# Patient Record
Sex: Male | Born: 1970 | Race: Black or African American | Hispanic: No | Marital: Single | Smoking: Never smoker
Health system: Southern US, Community
[De-identification: ages and names within clinical notes are randomized; demographics above are authoritative.]

## PROBLEM LIST (undated history)

## (undated) DIAGNOSIS — E119 Type 2 diabetes mellitus without complications: Secondary | ICD-10-CM

## (undated) HISTORY — DX: Type 2 diabetes mellitus without complications: E11.9

---

## 2004-05-25 ENCOUNTER — Emergency Department (HOSPITAL_COMMUNITY): Admission: EM | Admit: 2004-05-25 | Discharge: 2004-05-25 | Payer: Self-pay | Admitting: Emergency Medicine

## 2006-08-02 ENCOUNTER — Emergency Department (HOSPITAL_COMMUNITY): Admission: EM | Admit: 2006-08-02 | Discharge: 2006-08-02 | Payer: Self-pay | Admitting: Emergency Medicine

## 2007-03-28 ENCOUNTER — Emergency Department (HOSPITAL_COMMUNITY): Admission: EM | Admit: 2007-03-28 | Discharge: 2007-03-28 | Payer: Self-pay | Admitting: Emergency Medicine

## 2009-05-05 ENCOUNTER — Emergency Department (HOSPITAL_COMMUNITY): Admission: EM | Admit: 2009-05-05 | Discharge: 2009-05-05 | Payer: Self-pay | Admitting: Emergency Medicine

## 2010-12-31 LAB — URINALYSIS, ROUTINE W REFLEX MICROSCOPIC
Bilirubin Urine: NEGATIVE
Glucose, UA: NEGATIVE mg/dL
Ketones, ur: NEGATIVE mg/dL
Nitrite: NEGATIVE
Protein, ur: NEGATIVE mg/dL
Specific Gravity, Urine: 1.017 (ref 1.005–1.030)
Urobilinogen, UA: 1 mg/dL (ref 0.0–1.0)
pH: 6 (ref 5.0–8.0)

## 2010-12-31 LAB — URINE MICROSCOPIC-ADD ON

## 2012-11-28 ENCOUNTER — Ambulatory Visit: Payer: Self-pay | Admitting: *Deleted

## 2012-12-01 ENCOUNTER — Ambulatory Visit: Payer: Self-pay | Admitting: *Deleted

## 2012-12-17 ENCOUNTER — Encounter: Payer: Self-pay | Admitting: *Deleted

## 2012-12-17 ENCOUNTER — Encounter: Payer: BC Managed Care – PPO | Attending: *Deleted | Admitting: *Deleted

## 2012-12-17 VITALS — Ht 72.0 in | Wt 198.8 lb

## 2012-12-17 DIAGNOSIS — Z713 Dietary counseling and surveillance: Secondary | ICD-10-CM | POA: Insufficient documentation

## 2012-12-17 DIAGNOSIS — E119 Type 2 diabetes mellitus without complications: Secondary | ICD-10-CM | POA: Insufficient documentation

## 2012-12-17 NOTE — Patient Instructions (Signed)
Goals:  Follow Diabetes Meal Plan as instructed  Eat 3 meals and 2 snacks, every 3-5 hrs  Limit carbohydrate intake to 60-75 grams carbohydrate/meal  Limit carbohydrate intake to 30 grams carbohydrate/snack  Add lean protein foods to meals/snacks  Monitor glucose levels as instructed by your doctor  Aim for 30 mins of physical activity daily  Bring food record and glucose log to your next nutrition visit 

## 2012-12-17 NOTE — Progress Notes (Signed)
Diabetes Self-Management Education  Visit Number: First/Initial  12/17/2012 [name], identified by name and date of birth, is a 42 y.o. male with Diabetes Type: Type 2.  Other people present during visit:  Patient   ASSESSMENT  Patient Concerns:  Nutrition/Meal planning  Height 6' (1.829 m), weight 198 lb 12.8 oz (90.175 kg). Body mass index is 26.96 kg/(m^2).  Lab Results: HbA1c:12.9 mg/dl on 9/60/45   History reviewed. No pertinent family history. Patient ignorant of pertinent family histor  Support Systems:  Lives Alone Special Needs:  None  Prior DM Education:   Daily Foot Exams: No Patient Belief / Attitude about Diabetes:  Diabetes can be controlled    Diet Recall:  Skips breakfast currently because doesn't know what to eat.  Used to eat cereal with whole milk Might snack on some chips with water Eats rice, vegetables for lunch No pm snack Dinner is African food: rice, beans, meat, some vegetables   Education Topics Reviewed with Patient Today:  Topic Points Discussed  Disease State Factors that contribute to the development of diabetes  Nutrition Management Role of diet in the treatment of diabetes and the relationship between the three main macronutrients and blood glucose level;Food label reading, portion sizes and measuring food.;Carbohydrate counting  Physical Activity and Exercise Other (comment) (patient is already active)  Medications Other (comment) (educated patient to take metformin with food)  Monitoring Interpreting lab values - A1C, lipid, urine microalbumina.;Purpose and frequency of SMBG.  Acute Complications    Chronic Complications Relationship between chronic complications and blood glucose control  Psychosocial Adjustment    Goal Setting    Preconception Care (if applicable)      PATIENTS GOALS   Learning Objective(s):     Goal The patient agrees to:  Nutrition Follow meal plan discussed  Physical Activity Exercise 3-5 times  per week  Medications take my medication as prescribed  Monitoring Other (comment) (call insurance and start monitoring)  Problem Solving    Reducing Risk    Health Coping       If problems or questions, patient to contact team via:  Phone  Time in: 1030     Time out: 1130  Future DSME appointment: - 3-4 months   Jon Hoffman 12/17/2012 11:23 AM

## 2013-01-02 ENCOUNTER — Encounter: Payer: Self-pay | Admitting: Obstetrics and Gynecology

## 2013-03-20 ENCOUNTER — Ambulatory Visit: Payer: BC Managed Care – PPO | Admitting: *Deleted

## 2017-06-06 ENCOUNTER — Ambulatory Visit (INDEPENDENT_AMBULATORY_CARE_PROVIDER_SITE_OTHER): Payer: Self-pay | Admitting: Physician Assistant

## 2017-06-06 ENCOUNTER — Emergency Department (HOSPITAL_COMMUNITY): Payer: Self-pay

## 2017-06-06 ENCOUNTER — Encounter (HOSPITAL_COMMUNITY): Payer: Self-pay

## 2017-06-06 ENCOUNTER — Inpatient Hospital Stay (HOSPITAL_COMMUNITY)
Admission: EM | Admit: 2017-06-06 | Discharge: 2017-06-12 | DRG: 853 | Disposition: A | Discharge status: Home / Self Care | Payer: Self-pay | Attending: Family Medicine | Admitting: Family Medicine

## 2017-06-06 ENCOUNTER — Encounter: Payer: Self-pay | Admitting: Physician Assistant

## 2017-06-06 ENCOUNTER — Ambulatory Visit (INDEPENDENT_AMBULATORY_CARE_PROVIDER_SITE_OTHER): Payer: Self-pay

## 2017-06-06 VITALS — BP 128/97 | HR 138 | Temp 99.9°F | Resp 18

## 2017-06-06 DIAGNOSIS — R079 Chest pain, unspecified: Secondary | ICD-10-CM

## 2017-06-06 DIAGNOSIS — Z9889 Other specified postprocedural states: Secondary | ICD-10-CM

## 2017-06-06 DIAGNOSIS — Z23 Encounter for immunization: Secondary | ICD-10-CM

## 2017-06-06 DIAGNOSIS — R0602 Shortness of breath: Secondary | ICD-10-CM

## 2017-06-06 DIAGNOSIS — Z6828 Body mass index (BMI) 28.0-28.9, adult: Secondary | ICD-10-CM

## 2017-06-06 DIAGNOSIS — E872 Acidosis: Secondary | ICD-10-CM | POA: Diagnosis present

## 2017-06-06 DIAGNOSIS — Z9114 Patient's other noncompliance with medication regimen: Secondary | ICD-10-CM

## 2017-06-06 DIAGNOSIS — E663 Overweight: Secondary | ICD-10-CM | POA: Diagnosis present

## 2017-06-06 DIAGNOSIS — IMO0002 Reserved for concepts with insufficient information to code with codable children: Secondary | ICD-10-CM | POA: Diagnosis present

## 2017-06-06 DIAGNOSIS — Z7984 Long term (current) use of oral hypoglycemic drugs: Secondary | ICD-10-CM

## 2017-06-06 DIAGNOSIS — A419 Sepsis, unspecified organism: Principal | ICD-10-CM | POA: Diagnosis present

## 2017-06-06 DIAGNOSIS — E118 Type 2 diabetes mellitus with unspecified complications: Secondary | ICD-10-CM

## 2017-06-06 DIAGNOSIS — K029 Dental caries, unspecified: Secondary | ICD-10-CM | POA: Diagnosis present

## 2017-06-06 DIAGNOSIS — Z6825 Body mass index (BMI) 25.0-25.9, adult: Secondary | ICD-10-CM

## 2017-06-06 DIAGNOSIS — E876 Hypokalemia: Secondary | ICD-10-CM | POA: Diagnosis not present

## 2017-06-06 DIAGNOSIS — J9601 Acute respiratory failure with hypoxia: Secondary | ICD-10-CM | POA: Diagnosis present

## 2017-06-06 DIAGNOSIS — J984 Other disorders of lung: Secondary | ICD-10-CM

## 2017-06-06 DIAGNOSIS — E669 Obesity, unspecified: Secondary | ICD-10-CM | POA: Diagnosis present

## 2017-06-06 DIAGNOSIS — E1165 Type 2 diabetes mellitus with hyperglycemia: Secondary | ICD-10-CM | POA: Diagnosis present

## 2017-06-06 DIAGNOSIS — J181 Lobar pneumonia, unspecified organism: Secondary | ICD-10-CM

## 2017-06-06 DIAGNOSIS — J188 Other pneumonia, unspecified organism: Secondary | ICD-10-CM | POA: Diagnosis present

## 2017-06-06 DIAGNOSIS — J189 Pneumonia, unspecified organism: Secondary | ICD-10-CM | POA: Diagnosis present

## 2017-06-06 LAB — CBC
HCT: 40.7 % (ref 39.0–52.0)
HEMOGLOBIN: 13.7 g/dL (ref 13.0–17.0)
MCH: 26.6 pg (ref 26.0–34.0)
MCHC: 33.7 g/dL (ref 30.0–36.0)
MCV: 79 fL (ref 78.0–100.0)
Platelets: 206 10*3/uL (ref 150–400)
RBC: 5.15 MIL/uL (ref 4.22–5.81)
RDW: 13.6 % (ref 11.5–15.5)
WBC: 14.7 10*3/uL — ABNORMAL HIGH (ref 4.0–10.5)

## 2017-06-06 LAB — I-STAT CG4 LACTIC ACID, ED
LACTIC ACID, VENOUS: 2.08 mmol/L — AB (ref 0.5–1.9)
Lactic Acid, Venous: 1.96 mmol/L — ABNORMAL HIGH (ref 0.5–1.9)
Lactic Acid, Venous: 1.11 mmol/L (ref 0.5–1.9)

## 2017-06-06 LAB — URINALYSIS, ROUTINE W REFLEX MICROSCOPIC
BILIRUBIN URINE: NEGATIVE
Bacteria, UA: NONE SEEN
KETONES UR: 80 mg/dL — AB
LEUKOCYTES UA: NEGATIVE
NITRITE: NEGATIVE
PH: 6 (ref 5.0–8.0)
PROTEIN: 100 mg/dL — AB
Specific Gravity, Urine: 1.026 (ref 1.005–1.030)

## 2017-06-06 LAB — PROCALCITONIN: Procalcitonin: 1.17 ng/mL

## 2017-06-06 LAB — LACTIC ACID, PLASMA: LACTIC ACID, VENOUS: 1.6 mmol/L (ref 0.5–1.9)

## 2017-06-06 LAB — I-STAT TROPONIN, ED: TROPONIN I, POC: 0.02 ng/mL (ref 0.00–0.08)

## 2017-06-06 LAB — RAPID URINE DRUG SCREEN, HOSP PERFORMED
AMPHETAMINES: NOT DETECTED
BENZODIAZEPINES: NOT DETECTED
Barbiturates: NOT DETECTED
COCAINE: NOT DETECTED
OPIATES: NOT DETECTED
Tetrahydrocannabinol: NOT DETECTED

## 2017-06-06 LAB — POCT CBC
GRANULOCYTE PERCENT: 78.2 % (ref 37–80)
HEMATOCRIT: 43.5 % (ref 43.5–53.7)
Hemoglobin: 14.2 g/dL (ref 14.1–18.1)
LYMPH, POC: 1.8 (ref 0.6–3.4)
MCH, POC: 26.1 pg — AB (ref 27–31.2)
MCHC: 32.8 g/dL (ref 31.8–35.4)
MCV: 79.6 fL — AB (ref 80–97)
MID (cbc): 1.4 — AB (ref 0–0.9)
MPV: 9.3 fL (ref 0–99.8)
POC GRANULOCYTE: 11.7 — AB (ref 2–6.9)
POC LYMPH %: 12.1 % (ref 10–50)
POC MID %: 9.7 % (ref 0–12)
Platelet Count, POC: 209 10*3/uL (ref 142–424)
RBC: 5.46 M/uL (ref 4.69–6.13)
RDW, POC: 14.1 %
WBC: 14.9 10*3/uL — AB (ref 4.6–10.2)

## 2017-06-06 LAB — SAVE SMEAR

## 2017-06-06 LAB — RAPID HIV SCREEN (HIV 1/2 AB+AG)
HIV 1/2 Antibodies: NONREACTIVE
HIV-1 P24 ANTIGEN - HIV24: NONREACTIVE

## 2017-06-06 LAB — GLUCOSE, CAPILLARY: Glucose-Capillary: 256 mg/dL — ABNORMAL HIGH (ref 65–99)

## 2017-06-06 LAB — BASIC METABOLIC PANEL
ANION GAP: 13 (ref 5–15)
BUN: 12 mg/dL (ref 6–20)
CALCIUM: 8.9 mg/dL (ref 8.9–10.3)
CO2: 26 mmol/L (ref 22–32)
CREATININE: 1.21 mg/dL (ref 0.61–1.24)
Chloride: 97 mmol/L — ABNORMAL LOW (ref 101–111)
GFR calc Af Amer: >60 mL/min (ref >60)
GLUCOSE: 372 mg/dL — AB (ref 65–99)
Potassium: 4 mmol/L (ref 3.5–5.1)
Sodium: 136 mmol/L (ref 135–145)

## 2017-06-06 LAB — APTT: aPTT: 33 seconds (ref 24–36)

## 2017-06-06 LAB — HEPATIC FUNCTION PANEL
ALT: 20 U/L (ref 17–63)
AST: 14 U/L — ABNORMAL LOW (ref 15–41)
Albumin: 3.6 g/dL (ref 3.5–5.0)
Alkaline Phosphatase: 78 U/L (ref 38–126)
BILIRUBIN DIRECT: 0.1 mg/dL (ref 0.1–0.5)
BILIRUBIN INDIRECT: 1 mg/dL — AB (ref 0.3–0.9)
Total Bilirubin: 1.1 mg/dL (ref 0.3–1.2)
Total Protein: 8.1 g/dL (ref 6.5–8.1)

## 2017-06-06 LAB — POCT URINALYSIS DIP (MANUAL ENTRY)
BILIRUBIN UA: NEGATIVE
Glucose, UA: >=1000 mg/dL — AB
LEUKOCYTES UA: NEGATIVE
Nitrite, UA: NEGATIVE
SPEC GRAV UA: 1.02 (ref 1.010–1.025)
Urobilinogen, UA: 0.2 E.U./dL
pH, UA: 5.5 (ref 5.0–8.0)

## 2017-06-06 LAB — CREATININE, SERUM: Creatinine, Ser: 1.13 mg/dL (ref 0.61–1.24)

## 2017-06-06 LAB — CBG MONITORING, ED
Glucose-Capillary: 379 mg/dL — ABNORMAL HIGH (ref 65–99)
Glucose-Capillary: 290 mg/dL — ABNORMAL HIGH (ref 65–99)

## 2017-06-06 LAB — DIFFERENTIAL
BASOS ABS: 0 10*3/uL (ref 0.0–0.1)
BASOS PCT: 0 %
Eosinophils Absolute: 0 10*3/uL (ref 0.0–0.7)
Eosinophils Relative: 0 %
LYMPHS ABS: 1.3 10*3/uL (ref 0.7–4.0)
LYMPHS PCT: 9 %
MONOS PCT: 10 %
Monocytes Absolute: 1.5 10*3/uL — ABNORMAL HIGH (ref 0.1–1.0)
NEUTROS ABS: 11.6 10*3/uL — AB (ref 1.7–7.7)
Neutrophils Relative %: 81 %

## 2017-06-06 LAB — PROTIME-INR
INR: 1.05
PROTHROMBIN TIME: 13.6 s (ref 11.4–15.2)

## 2017-06-06 LAB — SEDIMENTATION RATE: SED RATE: 47 mm/h — AB (ref 0–16)

## 2017-06-06 LAB — LIPASE, BLOOD: LIPASE: 18 U/L (ref 11–51)

## 2017-06-06 LAB — GLUCOSE, POCT (MANUAL RESULT ENTRY): POC Glucose: 393 mg/dl — AB (ref 70–99)

## 2017-06-06 MED ORDER — ENOXAPARIN SODIUM 40 MG/0.4ML ~~LOC~~ SOLN
40.0000 mg | Freq: Every day | SUBCUTANEOUS | Status: DC
  Administered 2017-06-07 – 2017-06-11 (×5): 40 mg via SUBCUTANEOUS
  Filled 2017-06-06 (×5): qty 0.4

## 2017-06-06 MED ORDER — ACETAMINOPHEN 650 MG RE SUPP: 650.0000 mg | Freq: Four times a day (QID) | RECTAL | Status: DC | PRN

## 2017-06-06 MED ORDER — ONDANSETRON HCL 4 MG PO TABS: 4.0000 mg | ORAL_TABLET | Freq: Four times a day (QID) | ORAL | Status: DC | PRN

## 2017-06-06 MED ORDER — POLYETHYLENE GLYCOL 3350 17 G PO PACK
17.0000 g | PACK | Freq: Every day | ORAL | Status: DC | PRN
  Administered 2017-06-11: 17 g via ORAL
  Filled 2017-06-06: qty 1

## 2017-06-06 MED ORDER — AZITHROMYCIN 500 MG IV SOLR
500.0000 mg | INTRAVENOUS | Status: DC
  Administered 2017-06-07 – 2017-06-12 (×5): 500 mg via INTRAVENOUS
  Filled 2017-06-06 (×6): qty 500

## 2017-06-06 MED ORDER — SODIUM CHLORIDE 0.9 % IV BOLUS (SEPSIS)
1000.0000 mL | Freq: Once | INTRAVENOUS | Status: AC
  Administered 2017-06-06: 1000 mL via INTRAVENOUS

## 2017-06-06 MED ORDER — TUBERCULIN PPD 5 UNIT/0.1ML ID SOLN
5.0000 [IU] | Freq: Once | INTRADERMAL | Status: AC
  Administered 2017-06-06: 5 [IU] via INTRADERMAL
  Filled 2017-06-06: qty 0.1

## 2017-06-06 MED ORDER — IPRATROPIUM-ALBUTEROL 0.5-2.5 (3) MG/3ML IN SOLN: 3.0000 mL | RESPIRATORY_TRACT | Status: DC | PRN

## 2017-06-06 MED ORDER — DEXTROSE 5 % IV SOLN
1.0000 g | Freq: Once | INTRAVENOUS | Status: AC
  Administered 2017-06-06: 1 g via INTRAVENOUS
  Filled 2017-06-06: qty 10

## 2017-06-06 MED ORDER — ONDANSETRON HCL 4 MG/2ML IJ SOLN: 4.0000 mg | Freq: Four times a day (QID) | INTRAMUSCULAR | Status: DC | PRN

## 2017-06-06 MED ORDER — SODIUM CHLORIDE 0.9 % IV SOLN
1000.0000 mL | INTRAVENOUS | Status: DC
  Administered 2017-06-06: 1000 mL via INTRAVENOUS

## 2017-06-06 MED ORDER — ACETAMINOPHEN 325 MG PO TABS
650.0000 mg | ORAL_TABLET | Freq: Four times a day (QID) | ORAL | Status: DC | PRN
  Administered 2017-06-06 – 2017-06-11 (×6): 650 mg via ORAL
  Filled 2017-06-06 (×6): qty 2

## 2017-06-06 MED ORDER — SODIUM CHLORIDE 0.9 % IV SOLN
INTRAVENOUS | Status: DC
  Administered 2017-06-06 – 2017-06-08 (×3): via INTRAVENOUS

## 2017-06-06 MED ORDER — INSULIN ASPART 100 UNIT/ML ~~LOC~~ SOLN
0.0000 [IU] | Freq: Every day | SUBCUTANEOUS | Status: DC
  Administered 2017-06-06 – 2017-06-08 (×3): 3 [IU] via SUBCUTANEOUS
  Administered 2017-06-09 – 2017-06-10 (×2): 2 [IU] via SUBCUTANEOUS

## 2017-06-06 MED ORDER — INSULIN ASPART 100 UNIT/ML ~~LOC~~ SOLN
0.0000 [IU] | Freq: Three times a day (TID) | SUBCUTANEOUS | Status: DC
  Administered 2017-06-07: 5 [IU] via SUBCUTANEOUS
  Administered 2017-06-07 (×2): 3 [IU] via SUBCUTANEOUS
  Administered 2017-06-08: 5 [IU] via SUBCUTANEOUS
  Administered 2017-06-08 (×2): 2 [IU] via SUBCUTANEOUS
  Administered 2017-06-09: 7 [IU] via SUBCUTANEOUS
  Administered 2017-06-09: 2 [IU] via SUBCUTANEOUS
  Administered 2017-06-09: 1 [IU] via SUBCUTANEOUS
  Administered 2017-06-10: 3 [IU] via SUBCUTANEOUS
  Administered 2017-06-10: 7 [IU] via SUBCUTANEOUS

## 2017-06-06 MED ORDER — IOPAMIDOL (ISOVUE-370) INJECTION 76%
INTRAVENOUS | Status: AC
  Filled 2017-06-06: qty 100

## 2017-06-06 MED ORDER — IOPAMIDOL (ISOVUE-370) INJECTION 76%
100.0000 mL | Freq: Once | INTRAVENOUS | Status: AC | PRN
  Administered 2017-06-06: 100 mL via INTRAVENOUS

## 2017-06-06 MED ORDER — DEXTROSE 5 % IV SOLN
500.0000 mg | Freq: Once | INTRAVENOUS | Status: AC
  Administered 2017-06-06: 500 mg via INTRAVENOUS
  Filled 2017-06-06: qty 500

## 2017-06-06 MED ORDER — DEXTROSE 5 % IV SOLN
1.0000 g | INTRAVENOUS | Status: DC
  Filled 2017-06-06: qty 10

## 2017-06-06 NOTE — ED Provider Notes (Signed)
[facility]-EMERGENCY DEPT Provider Note   CSN: [id number] Arrival date & time: 06/06/17  1103     History   Chief Complaint Chief Complaint  Patient presents with  . Chest Pain    HPI Jon Hoffman is a 46 y.o. male.  HPI PATIENT REPORTS HE HAS RIGHT SIDED PAIN ON HIS LOWER CHEST. He states that he has a sharp pain it's been constant and worse with deep inspiration.Patient reports he just returned from a 14 hour driving trip from [place]. He denies pain or swelling in his legs. He denies similar problems with chest pain or shortness of breath. He denies recent fever, cough or hemoptysis. He denies nausea vomiting or abdominal pain. Patient denies prior history or known family history of DVT or PE. Patient is diabetic.He reports he ran out of his metformin and has not taken it for approximately 2 weeks. He denies any other known medical problems. Patient is from [place]. He reports he moved to the states in 2002. He reports last travel was 3 years ago. He reports he may have had malaria at one point time when he was in [place].Patient is nonsmoker. He denies drug use. Past Medical History:  Diagnosis Date  . Diabetes mellitus without complication (HCC)     There are no active problems to display for this patient.   History reviewed. No pertinent surgical history.     Home Medications    Prior to Admission medications   Medication Sig Start Date End Date Taking? Authorizing Provider  metFORMIN (GLUCOPHAGE) 500 MG tablet Take 500 mg by mouth 2 (two) times daily with a meal.   Yes [provider]    Family History History reviewed. No pertinent family history.  Social History Social History  Substance Use Topics  . Smoking status: Never Smoker  . Smokeless tobacco: Never Used  . Alcohol use No     Allergies   Patient has no known allergies.   Review of Systems Review of Systems 10 Systems reviewed and are negative for acute change except as noted in the  HPI.   Physical Exam Updated Vital Signs BP (!) 146/90   Pulse (!) 110   Temp 98.6 F (37 C) (Oral)   Resp (!) 31   Ht  (1.778 m)   Wt 88.9 kg (196 lb)   SpO2 96%   BMI 28.12 kg/m   Physical Exam  Constitutional: He is oriented to person, place, and time.  Patient has tachypnea. Supporting airway without difficulty. Alert and nontoxic. Well-nourished well-developed.  HENT:  Head: Normocephalic and atraumatic.  Mucous membranes are slightly dry. Posterior oropharynx is widely patent. Several areas of dental decay but does not have any facial swelling.  Eyes:  Extraocular motions normal. 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 (Chronic condition which she reports is been seen by ophthalmology in the past).  Neck: Neck supple.  Cardiovascular:  Tachycardia. No rub murmur gallop.  Pulmonary/Chest:  Tachypnea. No gross wheezes rhonchi rale.  Abdominal: Soft. He exhibits no distension. There is no tenderness. There is no guarding.  Musculoskeletal: Normal range of motion. He exhibits no edema, tenderness or deformity.  Neurological: He is alert and oriented to person, place, and time. No cranial nerve deficit. He exhibits normal muscle tone. Coordination normal.  Skin: Skin is warm and dry. No rash noted.  Psychiatric: He has a normal mood and affect.     ED  Treatments / Results  Labs (all labs ordered are listed, but only abnormal results are displayed) Labs Reviewed  BASIC METABOLIC PANEL - Abnormal; Notable for the following:       Result Value   Chloride 97 (*)    Glucose, Bld 372 (*)    All other components within normal limits  CBC - Abnormal; Notable for the following:    WBC 14.7 (*)    All other components within normal limits  HEPATIC FUNCTION PANEL - Abnormal; Notable for the following:    AST 14 (*)    Indirect Bilirubin 1.0 (*)    All other components within normal limits    CBG MONITORING, ED - Abnormal; Notable for the following:    Glucose-Capillary 379 (*)    All other components within normal limits  I-STAT CG4 LACTIC ACID, ED - Abnormal; Notable for the following:    Lactic Acid, Venous 2.08 (*)    All other components within normal limits  CULTURE, BLOOD (ROUTINE X 2)  CULTURE, BLOOD (ROUTINE X 2)  LIPASE, BLOOD  URINALYSIS, ROUTINE W REFLEX MICROSCOPIC  SEDIMENTATION RATE  SAVE SMEAR  RAPID HIV SCREEN (HIV 1/2 AB+AG)  DIFFERENTIAL  RAPID URINE DRUG SCREEN, HOSP PERFORMED  I-STAT TROPONIN, ED  I-STAT CG4 LACTIC ACID, ED    EKG  EKG Interpretation  Date/Time:  Thursday June 06 2017 11:27:19 EDT Ventricular Rate:  115 PR Interval:    QRS Duration: 73 QT Interval:  323 QTC Calculation: 447 R Axis:   59 Text Interpretation:  Sinus tachycardia agree. no STEMI Confirmed by Arby Barrette [phone]) on 06/06/2017 12:30:16 PM       Radiology Dg Chest 2 View  Result Date: 06/06/2017 CLINICAL DATA:  Chest pain. EXAM: CHEST  2 VIEW COMPARISON:  Chest x-ray from same date. FINDINGS: The cardiomediastinal silhouette is normal in size. Normal pulmonary vascularity. Persistent low lung volumes with bibasilar atelectasis. No pneumothorax or large pleural effusion. No acute osseous abnormality. IMPRESSION: Low lung volumes with bibasilar atelectasis, unchanged. Electronically Signed   By: Obie Dredge M.D.   On: 06/06/2017 12:02   Dg Chest 2 View  Result Date: 06/06/2017 CLINICAL DATA:  Chest pain. EXAM: CHEST  2 VIEW COMPARISON:  None. FINDINGS: The heart size and mediastinal contours are within normal limits. Hypoinflation of the lungs is noted with mild bibasilar subsegmental atelectasis. No pneumothorax or significant pleural effusion is noted. The visualized skeletal structures are unremarkable. IMPRESSION: Hypoinflation of the lungs with mild bibasilar subsegmental atelectasis. Electronically Signed   By: Lupita Raider, M.D.   On: 06/06/2017  09:58   Ct Angio Chest Pe W/cm &/or Wo Cm  Result Date: 06/06/2017 CLINICAL DATA:  Right-sided chest pain since yesterday. EXAM: CT ANGIOGRAPHY CHEST WITH CONTRAST TECHNIQUE: Multidetector CT imaging of the chest was performed using the standard protocol during bolus administration of intravenous contrast. Multiplanar CT image reconstructions and MIPs were obtained to evaluate the vascular anatomy. CONTRAST:  100 cc Isovue 370 IV COMPARISON:  CXR from 06/06/2017 FINDINGS: Cardiovascular: Heart size is normal without pericardial effusion. No acute pulmonary embolus. No aortic aneurysm or dissection. Mediastinum/Nodes: No enlarged mediastinal, hilar, or axillary lymph nodes. Thyroid gland, trachea, and esophagus demonstrate no significant findings. Lungs/Pleura: Thinly septated cavitary abnormality in the periphery of the right lower lobe. The cavitation measures 2.2 x 1.3 x 1.5 cm, image 70/5 and axial image 58/11. Surrounding pneumonic consolidation is identified and findings may reflect a cavitary pneumonia versus a pneumonic consolidations surrounding bullous emphysematous  change. Additional patchy airspace consolidations are seen along the posteromedial aspect of the right lower lobe with tubular filling defects noted within the right lower lobe bronchus. An endobronchial lesion causing post obstructive change versus inspissated mucus are among some possibilities. There is a trace right pleural effusion. There is atelectasis in the left lower lobe, some of which appears streaky another slightly more nodular along the major fissure. No additional airspace opacities or dominant mass is identified. Upper Abdomen: Hepatic steatosis of the included liver. The adrenal glands are excluded on this study. Musculoskeletal: No chest wall abnormality. No acute or significant osseous findings. Review of the MIP images confirms the above findings. IMPRESSION: 1. Tubular filling defects within right lower lobe bronchi  causing postobstructive pulmonary consolidations in the right lower lobe. Inspissated mucus or an endobronchial lesion are not excluded. 2. Along the right lateral basal segment of the lower lobe is a patchy pulmonary consolidation with central cavitation, thinly septated in appearance internally. Trace associated right pleural effusion. Differential possibilities may include a cavitary pneumonia or pneumonia outlining bullous emphysematous change. As neoplasm cannot be entirely excluded, repeat CT imaging after appropriate antibiotic therapy 3-4 weeks is recommended to assure improvement and/or resolution. 3. Lesser degree of parenchymal opacification/consolidation in the left lower lobe. 4. No acute pulmonary embolus. 5. Hepatic steatosis. Emphysema (ICD10-J43.9). Electronically Signed   By: Tollie Eth M.D.   On: 06/06/2017 13:41    Procedures Procedures (including critical care time) CRITICAL CARE Performed by: Arby Barrette   Total critical care time:30 minutes  Critical care time was exclusive of separately billable procedures and treating other patients.  Critical care was necessary to treat or prevent imminent or life-threatening deterioration.  Critical care was time spent personally by me on the following activities: development of treatment plan with patient and/or surrogate as well as nursing, discussions with consultants, evaluation of patient's response to treatment, examination of patient, obtaining history from patient or surrogate, ordering and performing treatments and interventions, ordering and review of laboratory studies, ordering and review of radiographic studies, pulse oximetry and re-evaluation of patient's condition. Medications Ordered in ED Medications  sodium chloride 0.9 % bolus 1,000 mL (1,000 mLs Intravenous New Bag/Given 06/06/17 1348)    Followed by  0.9 %  sodium chloride infusion (1,000 mLs Intravenous New Bag/Given 06/06/17 1349)  iopamidol (ISOVUE-370) 76 %  injection (not administered)  sodium chloride 0.9 % bolus 1,000 mL (not administered)    And  sodium chloride 0.9 % bolus 1,000 mL (not administered)    And  sodium chloride 0.9 % bolus 1,000 mL (not administered)  cefTRIAXone (ROCEPHIN) 1 g in dextrose 5 % 50 mL IVPB (not administered)  azithromycin (ZITHROMAX) 500 mg in dextrose 5 % 250 mL IVPB (not administered)  tuberculin injection 5 Units (not administered)  iopamidol (ISOVUE-370) 76 % injection 100 mL (100 mLs Intravenous Contrast Given 06/06/17 1312)     Initial Impression / Assessment and Plan / ED Course  I have reviewed the triage vital signs and the nursing notes.  Pertinent labs & imaging results that were available during my care of the patient were reviewed by me and considered in my medical decision making (see chart for details).  Clinical Course as of Jun 07 1419  Thu Jun 06, 2017  1229 MCV: 79.0 [MP]    Clinical Course User Index [MP] Arby Barrette, MD  consult: Hospitalist for admission  Final Clinical Impressions(s) / ED Diagnoses   Final diagnoses:  Community acquired pneumonia of  right lower lobe of lung (HCC)  Sepsis, due to unspecified organism [facility](HCC)   Patient describes acute onset of symptoms. He reports developing right-sided chest pain and dyspnea. On examination patient is tachypneaic and tachycardic. CT scan shows a consolidation in the lower right lung. Patient is from [place] but denies travel outside of the [place]  for 3 years. Patient denies that he has had any visitors from [place]. He reports [place] alone [place]. He is a poorly treated diabetic. Patient reports been noncompliant with his metformin for about 2 weeks.patient is moderately hyperglycemic but is not acidotic. Patient's lactic acid mildly elevated. Sepsis protocol initiated for any community-acquired pneumonia.HIV and tuberculin testing added. Patient denies any history of drug abuse. New Prescriptions New Prescriptions   No  medications on file     Arby BarrettePfeiffer, Ocean Kearley, MD 06/06/17 1514

## 2017-06-06 NOTE — ED Triage Notes (Addendum)
Patient BIB EMS from walk-in [facility] office. Patient complaining of right flank pain and hyperglycemia. Patient with history of Diabetes since 2014- non-insulin dependent. Patient reports having run out of his metformin 2 weeks ago and hasnt gotten it refilled. Patient reports frequent urination. Patient reports the flank pain started at 1300 yesterday. Yesterday, patient drove from [place] to [place].  Patient oxygen saturations on RA are 100%. Patient denies SOB, but is tachypnic due to pain. Patient denies leg pain. Patient denies N/V/D/fever. Patient denies dysuria/hematuria. 20G Left AC - patient received 150mL NS from EMS.

## 2017-06-06 NOTE — Progress Notes (Signed)
RN may call [phone] at [phone].

## 2017-06-06 NOTE — ED Notes (Signed)
ED TO INPATIENT HANDOFF REPORT  Name/Age/Gender Jon Hoffman 46 y.o. male  Code Status Code Status History    This patient does not have a recorded code status. Please follow your organizational policy for patients in this situation.      Home/SNF/Other Home  Chief Complaint Hyperglycemia, Flank Pain   Level of Care/Admitting Diagnosis ED Disposition    ED Disposition Condition [facility] Area: [place] [[id number]]  Level of Care: Med-Surg [16]  Diagnosis: Acute respiratory failure with hypoxia [facility]) [[id number]]  Admitting Physician: Junius Argyle  Attending Physician: Annita Brod [[id number]]  Estimated length of stay: past midnight tomorrow  Certification:: I certify this patient will need inpatient services for at least 2 midnights  Bed request comments: TB rule out-resp isolation  PT Class (Do Not Modify): Inpatient [101]  PT Acc Code (Do Not Modify): Private [1]       Medical History Past Medical History:  Diagnosis Date  . Diabetes mellitus without complication ([place])     Allergies No Known Allergies  IV Location/Drains/Wounds Patient Lines/Drains/Airways Status   Active Line/Drains/Airways    Name:   Placement date:   Placement time:   Site:   Days:   Peripheral IV 06/06/17 Left Antecubital  06/06/17    1029    Antecubital    less than 1   Peripheral IV 06/06/17 Right Antecubital  06/06/17    1349    Antecubital    less than 1   Peripheral IV 06/06/17 Right Hand  06/06/17    1400    Hand    less than 1          Labs/Imaging Results for orders placed or performed during the hospital encounter of 06/06/17 (from the past 48 hour(s))  CBG monitoring, ED     Status: Abnormal   Collection Time: 06/06/17 11:22 AM  Result Value Ref Range   Glucose-Capillary 379 (H) 65 - 99 mg/dL   Comment 1 Notify RN    Comment 2 Document in Chart   Basic metabolic panel     Status: Abnormal   Collection Time: 06/06/17 11:25  AM  Result Value Ref Range   Sodium 136 135 - 145 mmol/L   Potassium 4.0 3.5 - 5.1 mmol/L   Chloride 97 (L) 101 - 111 mmol/L   CO2 26 22 - 32 mmol/L   Glucose, Bld 372 (H) 65 - 99 mg/dL   BUN 12 6 - 20 mg/dL   Creatinine, Ser 1.21 0.61 - 1.24 mg/dL   Calcium 8.9 8.9 - 10.3 mg/dL   GFR calc non Af Amer >60 >60 mL/min   GFR calc Af Amer >60 >60 mL/min    Comment: (NOTE) The eGFR has been calculated using the CKD EPI equation. This calculation has not been validated in all clinical situations. eGFR's persistently <60 mL/min signify possible Chronic Kidney Disease.    Anion gap 13 5 - 15  CBC     Status: Abnormal   Collection Time: 06/06/17 11:25 AM  Result Value Ref Range   WBC 14.7 (H) 4.0 - 10.5 K/uL   RBC 5.15 4.22 - 5.81 MIL/uL   Hemoglobin 13.7 13.0 - 17.0 g/dL   HCT 40.7 39.0 - 52.0 %   MCV 79.0 78.0 - 100.0 fL   MCH 26.6 26.0 - 34.0 pg   MCHC 33.7 30.0 - 36.0 g/dL   RDW 13.6 11.5 - 15.5 %   Platelets 206 150 - 400  K/uL  Hepatic function panel     Status: Abnormal   Collection Time: 06/06/17 11:25 AM  Result Value Ref Range   Total Protein 8.1 6.5 - 8.1 g/dL   Albumin 3.6 3.5 - 5.0 g/dL   AST 14 (L) 15 - 41 U/L   ALT 20 17 - 63 U/L   Alkaline Phosphatase 78 38 - 126 U/L   Total Bilirubin 1.1 0.3 - 1.2 mg/dL   Bilirubin, Direct 0.1 0.1 - 0.5 mg/dL   Indirect Bilirubin 1.0 (H) 0.3 - 0.9 mg/dL  Lipase, blood     Status: None   Collection Time: 06/06/17 11:25 AM  Result Value Ref Range   Lipase 18 11 - 51 U/L  Sedimentation rate     Status: Abnormal   Collection Time: 06/06/17 11:25 AM  Result Value Ref Range   Sed Rate 47 (H) 0 - 16 mm/hr  Save smear     Status: None   Collection Time: 06/06/17 11:25 AM  Result Value Ref Range   Smear Review SMEAR STAINED AND AVAILABLE FOR REVIEW   Differential     Status: Abnormal   Collection Time: 06/06/17 11:25 AM  Result Value Ref Range   Neutrophils Relative % 81 %   Neutro Abs 11.6 (H) 1.7 - 7.7 K/uL   Lymphocytes  Relative 9 %   Lymphs Abs 1.3 0.7 - 4.0 K/uL   Monocytes Relative 10 %   Monocytes Absolute 1.5 (H) 0.1 - 1.0 K/uL   Eosinophils Relative 0 %   Eosinophils Absolute 0.0 0.0 - 0.7 K/uL   Basophils Relative 0 %   Basophils Absolute 0.0 0.0 - 0.1 K/uL  I-stat troponin, ED     Status: None   Collection Time: 06/06/17 11:32 AM  Result Value Ref Range   Troponin i, poc 0.02 0.00 - 0.08 ng/mL   Comment 3            Comment: Due to the release kinetics of cTnI, a negative result within the first hours of the onset of symptoms does not rule out myocardial infarction with certainty. If myocardial infarction is still suspected, repeat the test at appropriate intervals.   Urinalysis, Routine w reflex microscopic     Status: Abnormal   Collection Time: 06/06/17  1:30 PM  Result Value Ref Range   Color, Urine STRAW (A) YELLOW   APPearance CLEAR CLEAR   Specific Gravity, Urine 1.026 1.005 - 1.030   pH 6.0 5.0 - 8.0   Glucose, UA >=500 (A) NEGATIVE mg/dL   Hgb urine dipstick SMALL (A) NEGATIVE   Bilirubin Urine NEGATIVE NEGATIVE   Ketones, ur 80 (A) NEGATIVE mg/dL   Protein, ur 940 (A) NEGATIVE mg/dL   Nitrite NEGATIVE NEGATIVE   Leukocytes, UA NEGATIVE NEGATIVE   RBC / HPF 0-5 0 - 5 RBC/hpf   WBC, UA 0-5 0 - 5 WBC/hpf   Bacteria, UA NONE SEEN NONE SEEN   Squamous Epithelial / LPF 0-5 (A) NONE SEEN  Urine rapid drug screen (hosp performed)     Status: None   Collection Time: 06/06/17  1:30 PM  Result Value Ref Range   Opiates NONE DETECTED NONE DETECTED   Cocaine NONE DETECTED NONE DETECTED   Benzodiazepines NONE DETECTED NONE DETECTED   Amphetamines NONE DETECTED NONE DETECTED   Tetrahydrocannabinol NONE DETECTED NONE DETECTED   Barbiturates NONE DETECTED NONE DETECTED    Comment:        DRUG SCREEN FOR MEDICAL PURPOSES ONLY.  IF CONFIRMATION  IS NEEDED FOR ANY PURPOSE, NOTIFY LAB WITHIN 5 DAYS.        LOWEST DETECTABLE LIMITS FOR URINE DRUG SCREEN Drug Class       Cutoff  (ng/mL) Amphetamine      1000 Barbiturate      200 Benzodiazepine   200 Tricyclics       300 Opiates          300 Cocaine          300 THC              50   I-Stat CG4 Lactic Acid, ED     Status: Abnormal   Collection Time: 06/06/17  1:49 PM  Result Value Ref Range   Lactic Acid, Venous 2.08 (HH) 0.5 - 1.9 mmol/L   Comment NOTIFIED PHYSICIAN   Rapid HIV screen (HIV 1/2 Ab+Ag)     Status: None   Collection Time: 06/06/17  2:17 PM  Result Value Ref Range   HIV-1 P24 Antigen - HIV24 NON REACTIVE NON REACTIVE    Comment: RESULT CALLED TO, READ BACK BY AND VERIFIED WITH: BRAWNER,A @ 1724 ON [id number] BY POTEAT,S    HIV 1/2 Antibodies NON REACTIVE NON REACTIVE   Interpretation (HIV Ag Ab)      A non reactive test result means that HIV 1 or HIV 2 antibodies and HIV 1 p24 antigen were not detected in the specimen.  I-Stat CG4 Lactic Acid, ED  (not at  [facility])     Status: Abnormal   Collection Time: 06/06/17  4:10 PM  Result Value Ref Range   Lactic Acid, Venous 1.96 (H) 0.5 - 1.9 mmol/L   Dg Chest 2 View  Result Date: 06/06/2017 CLINICAL DATA:  Chest pain. EXAM: CHEST  2 VIEW COMPARISON:  Chest x-ray from same date. FINDINGS: The cardiomediastinal silhouette is normal in size. Normal pulmonary vascularity. Persistent low lung volumes with bibasilar atelectasis. No pneumothorax or large pleural effusion. No acute osseous abnormality. IMPRESSION: Low lung volumes with bibasilar atelectasis, unchanged. Electronically Signed   By: Obie Dredge M.D.   On: 06/06/2017 12:02   Dg Chest 2 View  Result Date: 06/06/2017 CLINICAL DATA:  Chest pain. EXAM: CHEST  2 VIEW COMPARISON:  None. FINDINGS: The heart size and mediastinal contours are within normal limits. Hypoinflation of the lungs is noted with mild bibasilar subsegmental atelectasis. No pneumothorax or significant pleural effusion is noted. The visualized skeletal structures are unremarkable. IMPRESSION: Hypoinflation of the lungs with mild  bibasilar subsegmental atelectasis. Electronically Signed   By: Lupita Raider, M.D.   On: 06/06/2017 09:58   Ct Angio Chest Pe W/cm &/or Wo Cm  Result Date: 06/06/2017 CLINICAL DATA:  Right-sided chest pain since yesterday. EXAM: CT ANGIOGRAPHY CHEST WITH CONTRAST TECHNIQUE: Multidetector CT imaging of the chest was performed using the standard protocol during bolus administration of intravenous contrast. Multiplanar CT image reconstructions and MIPs were obtained to evaluate the vascular anatomy. CONTRAST:  100 cc Isovue 370 IV COMPARISON:  CXR from 06/06/2017 FINDINGS: Cardiovascular: Heart size is normal without pericardial effusion. No acute pulmonary embolus. No aortic aneurysm or dissection. Mediastinum/Nodes: No enlarged mediastinal, hilar, or axillary lymph nodes. Thyroid gland, trachea, and esophagus demonstrate no significant findings. Lungs/Pleura: Thinly septated cavitary abnormality in the periphery of the right lower lobe. The cavitation measures 2.2 x 1.3 x 1.5 cm, image 70/5 and axial image 58/11. Surrounding pneumonic consolidation is identified and findings may reflect a cavitary pneumonia versus a pneumonic consolidations surrounding bullous  emphysematous change. Additional patchy airspace consolidations are seen along the posteromedial aspect of the right lower lobe with tubular filling defects noted within the right lower lobe bronchus. An endobronchial lesion causing post obstructive change versus inspissated mucus are among some possibilities. There is a trace right pleural effusion. There is atelectasis in the left lower lobe, some of which appears streaky another slightly more nodular along the major fissure. No additional airspace opacities or dominant mass is identified. Upper Abdomen: Hepatic steatosis of the included liver. The adrenal glands are excluded on this study. Musculoskeletal: No chest wall abnormality. No acute or significant osseous findings. Review of the MIP images  confirms the above findings. IMPRESSION: 1. Tubular filling defects within right lower lobe bronchi causing postobstructive pulmonary consolidations in the right lower lobe. Inspissated mucus or an endobronchial lesion are not excluded. 2. Along the right lateral basal segment of the lower lobe is a patchy pulmonary consolidation with central cavitation, thinly septated in appearance internally. Trace associated right pleural effusion. Differential possibilities may include a cavitary pneumonia or pneumonia outlining bullous emphysematous change. As neoplasm cannot be entirely excluded, repeat CT imaging after appropriate antibiotic therapy 3-4 weeks is recommended to assure improvement and/or resolution. 3. Lesser degree of parenchymal opacification/consolidation in the left lower lobe. 4. No acute pulmonary embolus. 5. Hepatic steatosis. Emphysema (ICD10-J43.9). Electronically Signed   By: Ashley Royalty M.D.   On: 06/06/2017 13:41    Pending Labs [employer]    Start     Ordered   06/06/17 1239  Culture, blood (routine x 2)  BLOOD CULTURE X 2,   STAT     06/06/17 1239   Signed and Held  Culture, sputum-assessment  Once,   R     Signed and Held   Signed and Held  Gram stain  Once,   R     Signed and Held   Signed and Held  HIV antibody  Once,   R     Signed and Held   Signed and Held  Strep pneumoniae urinary antigen  Once,   R     Signed and Held   Signed and Held  Creatinine, serum  (enoxaparin (LOVENOX)    CrCl >/= 30 ml/min)  Once,   R    Comments:  Baseline for enoxaparin therapy IF NOT ALREADY DRAWN.    Signed and Held   Signed and Held  Creatinine, serum  (enoxaparin (LOVENOX)    CrCl >/= 30 ml/min)  Weekly,   R    Comments:  while on enoxaparin therapy    Signed and Held   Signed and Held  Basic metabolic panel  Tomorrow morning,   R     Signed and Held   Signed and Held  CBC  Tomorrow morning,   R     Signed and Held   Signed and Held  Hemoglobin A1c  Once,   R    Comments:   To assess prior glycemic control    Signed and Held   Signed and Held  Lactic acid, plasma  STAT Now then every 3 hours,   STAT     Signed and Held   Signed and Held  Procalcitonin  STAT,   R     Signed and Held   Signed and Held  Protime-INR  STAT,   R     Signed and Held   Signed and Held  APTT  STAT,   R     Signed and Held  Vitals/Pain Today's Vitals   06/06/17 1945 06/06/17 2000 06/06/17 2015 06/06/17 2030  BP: (!) 147/94 (!) 152/99 (!) 146/92 (!) 162/95  Pulse: (!) 107 (!) 110 (!) 106 (!) 112  Resp: (!) 23 (!) 28 (!) 27 (!) 30  Temp:      TempSrc:      SpO2: 96% 94% 97% 98%  Weight:      Height:      PainSc:        Isolation Precautions Airborne precautions  Medications Medications  sodium chloride 0.9 % bolus 1,000 mL (0 mLs Intravenous Stopped 06/06/17 1425)    Followed by  0.9 %  sodium chloride infusion (1,000 mLs Intravenous New Bag/Given 06/06/17 1349)  iopamidol (ISOVUE-370) 76 % injection (not administered)  tuberculin injection 5 Units (not administered)  iopamidol (ISOVUE-370) 76 % injection 100 mL (100 mLs Intravenous Contrast Given 06/06/17 1312)  sodium chloride 0.9 % bolus 1,000 mL (0 mLs Intravenous Stopped 06/06/17 1642)    And  sodium chloride 0.9 % bolus 1,000 mL (0 mLs Intravenous Stopped 06/06/17 1727)    And  sodium chloride 0.9 % bolus 1,000 mL (0 mLs Intravenous Stopped 06/06/17 1727)  cefTRIAXone (ROCEPHIN) 1 g in dextrose 5 % 50 mL IVPB (0 g Intravenous Stopped 06/06/17 1642)  azithromycin (ZITHROMAX) 500 mg in dextrose 5 % 250 mL IVPB (0 mg Intravenous Stopped 06/06/17 1642)    Mobility walks with person assist

## 2017-06-06 NOTE — H&P (Signed)
History and Physical  Isak Gauer YHO:[id number] DOB: [birth year] DOA: 06/06/2017  Referring physician: Thalia Party, ER physician  PCP: Everardo Beals, NP  Outpatient Specialists: None Patient coming from: Home & is able to ambulate without assistance  Chief Complaint: Cough and chest pain   HPI: Jon Hoffman is a 46 y.o. male with medical history significant for diabetes mellitus, although he has been without his metformin for the past 2 weeks, but otherwise states he has been his usual state of health up until yesterday. Patient is originally from [place], but does a lot of traveling for his work. He drove 17 hours from [place] to [place] pretty much straight. He arrived in [place] yesterday. Start having significant shortness of breath, cough and discomfort on the right side of his chest which he describes as an ache hurting worse when he sat up or leaned that way. He also felt feverish. Pain was unrelenting, so he came into the emergency room early this morning. ED Course:  In the emergency room, patient's white count noted to be 14.9 and a temp of 101.5. Patient is tachycardic in the 120s-130s. Initial oxygen saturations were 97% on room air although as day has progressed, has dropped somewhat into the low 90s. Patient underwent a CT scan of the chest which noted no evidence of pulmonary embolus, but did note postobstructive pulmonary consolidations in the right lower lobe possibly from mucus on endobronchial lesion which could not be excluded, but along the right lateral base of the lower lobe was patchy pulmonary consolidation with central cavitation which was felt to be a cavitary pneumonia versus pneumonia with outlying bolus emphysematous changes. Is also some lesser degree of consolidation in the left lower lobe. Following these findings, patient had a lactic acid level drawn found to be elevated at 2.08. Patient treated for sepsis felt to be secondary to pneumonia. Patient  received a total of 5 L of fluid. He was given antibiotics for community-acquired pneumonia, but given concerns with cavitary pneumonia and patient is originally from [place], TB could not be ruled out. Patient was placed in respiratory isolation. Hospitalists were called for further evaluation and admission.  Review of Systems: Patient seen in the emergency room . Pt complains of some mild chest discomfort, especially with deep inspiration. This chest discomfort is on the right-hand side as described earlier area and he does not feel really short of breath, but does complain of cough, occasionally with productive sputum. He states all of this only started yesterday. He feels feverish.   Pt denies any  headaches, vision changes, dysphagia, palpitations, wheezing, abdominal pain, hematuria, dysuria, constipation, diarrhea, focal extremity numbness weakness or pain .  Review of systems are otherwise negative   Past Medical History:  Diagnosis Date  . Diabetes mellitus without complication ([place])   Diabetes mellitus type 2, uncontrolled-has been off of medications for several weeks  History reviewed. No pertinent surgical history.  Social History:  reports that he has never smoked. He has never used smokeless tobacco. He reports that he does not drink alcohol or use drugs.  Lives at home by himself. Ambulatory without assistance.    No Known Allergies  Family history: Patient denies any family history of medical problems including heart problems or TB  Prior to Admission medications   Medication Sig Start Date End Date Taking? Authorizing Provider  metFORMIN (GLUCOPHAGE) 500 MG tablet Take 500 mg by mouth 2 (two) times daily with a meal.   Yes [provider]  Physical Exam: BP (!) 141/92   Pulse (!) 107   Temp 98.1 F (36.7 C) (Oral)   Resp (!) 28   Ht _0  (1.778 m)   Wt 88.9 kg (196 lb)   SpO2 92%   BMI 28.12 kg/m   General:  Alert and oriented 3, no acute distress    Eyes: sclera nonicteric, extraocular movements are intact  ENT: normocephalic may try to come mucus memories are dry  Neck: supple, no JVD  Cardiovascular: regular rhythm, borderline tachycardia  Respiratory: Limited inspiratory effort with right basilar crackles  Abdomen: soft, obese, nontender, positive bowel sounds  Skin: no skin breaks, tears or lesions  Musculoskeletal: no clubbing or cyanosis or edema  Psychiatric: patient is appropriate, no evidence of psychoses  Neurologic: no focal deficits           Labs on Admission:  Basic Metabolic Panel:  Recent Labs Lab 06/06/17 1125  NA 136  K 4.0  CL 97*  CO2 26  GLUCOSE 372*  BUN 12  CREATININE 1.21  CALCIUM 8.9   Liver Function Tests:  Recent Labs Lab 06/06/17 1125  AST 14*  ALT 20  ALKPHOS 78  BILITOT 1.1  PROT 8.1  ALBUMIN 3.6    Recent Labs Lab 06/06/17 1125  LIPASE 18   No results for input(s): AMMONIA in the last 168 hours. CBC:  Recent Labs Lab 06/06/17 0947 06/06/17 1125  WBC 14.9* 14.7*  NEUTROABS  --  11.6*  HGB 14.2 13.7  HCT 43.5 40.7  MCV 79.6* 79.0  PLT  --  206   Cardiac Enzymes: No results for input(s): CKTOTAL, CKMB, CKMBINDEX, TROPONINI in the last 168 hours.  BNP (last 3 results) No results for input(s): BNP in the last 8760 hours.  ProBNP (last 3 results) No results for input(s): PROBNP in the last 8760 hours.  CBG:  Recent Labs Lab 06/06/17 1122  GLUCAP 379*    Radiological Exams on Admission: Dg Chest 2 View  Result Date: 06/06/2017 CLINICAL DATA:  Chest pain. EXAM: CHEST  2 VIEW COMPARISON:  Chest x-ray from same date. FINDINGS: The cardiomediastinal silhouette is normal in size. Normal pulmonary vascularity. Persistent low lung volumes with bibasilar atelectasis. No pneumothorax or large pleural effusion. No acute osseous abnormality. IMPRESSION: Low lung volumes with bibasilar atelectasis, unchanged. Electronically Signed   By: Titus Dubin M.D.   On:  06/06/2017 12:02   Dg Chest 2 View  Result Date: 06/06/2017 CLINICAL DATA:  Chest pain. EXAM: CHEST  2 VIEW COMPARISON:  None. FINDINGS: The heart size and mediastinal contours are within normal limits. Hypoinflation of the lungs is noted with mild bibasilar subsegmental atelectasis. No pneumothorax or significant pleural effusion is noted. The visualized skeletal structures are unremarkable. IMPRESSION: Hypoinflation of the lungs with mild bibasilar subsegmental atelectasis. Electronically Signed   By: Marijo Conception, M.D.   On: 06/06/2017 09:58   Ct Angio Chest Pe W/cm &/or Wo Cm  Result Date: 06/06/2017 CLINICAL DATA:  Right-sided chest pain since yesterday. EXAM: CT ANGIOGRAPHY CHEST WITH CONTRAST TECHNIQUE: Multidetector CT imaging of the chest was performed using the standard protocol during bolus administration of intravenous contrast. Multiplanar CT image reconstructions and MIPs were obtained to evaluate the vascular anatomy. CONTRAST:  100 cc Isovue 370 IV COMPARISON:  CXR from 06/06/2017 FINDINGS: Cardiovascular: Heart size is normal without pericardial effusion. No acute pulmonary embolus. No aortic aneurysm or dissection. Mediastinum/Nodes: No enlarged mediastinal, hilar, or axillary lymph nodes. Thyroid gland, trachea, and  esophagus demonstrate no significant findings. Lungs/Pleura: Thinly septated cavitary abnormality in the periphery of the right lower lobe. The cavitation measures 2.2 x 1.3 x 1.5 cm, image 70/5 and axial image 58/11. Surrounding pneumonic consolidation is identified and findings may reflect a cavitary pneumonia versus a pneumonic consolidations surrounding bullous emphysematous change. Additional patchy airspace consolidations are seen along the posteromedial aspect of the right lower lobe with tubular filling defects noted within the right lower lobe bronchus. An endobronchial lesion causing post obstructive change versus inspissated mucus are among some possibilities.  There is a trace right pleural effusion. There is atelectasis in the left lower lobe, some of which appears streaky another slightly more nodular along the major fissure. No additional airspace opacities or dominant mass is identified. Upper Abdomen: Hepatic steatosis of the included liver. The adrenal glands are excluded on this study. Musculoskeletal: No chest wall abnormality. No acute or significant osseous findings. Review of the MIP images confirms the above findings. IMPRESSION: 1. Tubular filling defects within right lower lobe bronchi causing postobstructive pulmonary consolidations in the right lower lobe. Inspissated mucus or an endobronchial lesion are not excluded. 2. Along the right lateral basal segment of the lower lobe is a patchy pulmonary consolidation with central cavitation, thinly septated in appearance internally. Trace associated right pleural effusion. Differential possibilities may include a cavitary pneumonia or pneumonia outlining bullous emphysematous change. As neoplasm cannot be entirely excluded, repeat CT imaging after appropriate antibiotic therapy 3-4 weeks is recommended to assure improvement and/or resolution. 3. Lesser degree of parenchymal opacification/consolidation in the left lower lobe. 4. No acute pulmonary embolus. 5. Hepatic steatosis. Emphysema (ICD10-J43.9). Electronically Signed   By: Ashley Royalty M.D.   On: 06/06/2017 13:41    EKG: Independently reviewed.  sinus tachycardia  Assessment/Plan Present on Admission: . Sepsis secondary to cavitary pneumonia: Patient met criteria for sepsis on admission given tachycardia, tachypnea, fever and leukocytosis plus lactic acidosis with pulmonary source We'll treat as community-acquired pneumonia. IV Rocephin and Zithromax. Aggressive IV fluid resuscitation, follow lactic acid level Supplemental oxygen. We'll place and TB isolation. If no significant improvement by tomorrow, we'll discuss with pulmonary . CAP (community  acquired pneumonia)  . Uncontrolled type 2 diabetes mellitus with complication, without long-term current use of insulin ([place]): Patient only on metformin, but has not been on his medicines for 2 weeks. Stated he ran out and he does not really see his doctor because he is constantly traveling.  Overweight: Patient meets criteria with BMI greater than 25  Principal Problem:   Sepsis ([place]) Active Problems:   Acute respiratory failure with hypoxia ([facility])   CAP (community acquired pneumonia)   Uncontrolled type 2 diabetes mellitus with complication, without long-term current use of insulin ([facility])   DVT prophylaxis:  Lovenox  Code Status:  Full code   Family Communication:  No family   Disposition Plan:  Continue inpatient until pneumonia resolved, TB ruled out   Consults called:  None   Admission status:  Given patient's need for acute services past to midnight, admit as inpatient    Annita Brod MD [facility] Pager [phone]  If 7PM-7AM, please contact night-coverage [email or web address] Password [email or web address]  06/06/2017, 7:10 PM

## 2017-06-06 NOTE — Progress Notes (Signed)
A consult was received from an ED physician for Rocephin/Zithromax per pharmacy dosing.  The patient's profile has been reviewed for ht/wt/allergies/indication/available labs.   A one time order has been placed for Rocephin/Zithromax.  Further antibiotics/pharmacy consults should be ordered by admitting physician if indicated.                       Thank you, Junita PushMichelle Raeleen Winstanley, PharmD, BCPS 06/06/2017@2 :31 PM

## 2017-06-06 NOTE — Progress Notes (Signed)
PRIMARY CARE AT [facility] [address] [place] [address] [phone]  Date:  06/06/2017   Name:  Jon Hoffman   DOB:  01-04-1971   MRN:  [id number]  PCP:  Marva Panda, NP    History of Present Illness:  Jon Hoffman is a 46 y.o. male patient who presents to PCP with  Chief Complaint  Patient presents with  . Chest Pain    Patient presents with R side chest pain x 20 hours, no leg or arm pain, no radiation of pain.      Patient is seen here for chest pains that started yesterday, at his lower right side.  It is a sharp stabbing pain localized.  He has felt pain with deep inspiration on his right side.  No nausea, palpitations, or dyspnea--he reports.  He does not recall any injury.  He was driving for 17 hours initially.  He has had no leg or calf pain.  No coughing.  Leaning on his right side while he drove.  The pain has stayed the same and not worsening since initiated.  He does not take aspirin.  He has been out of the metformin for 2 weeks..   No dysuria, no urinary frequency.  He has not felt fever.    There are no active problems to display for this patient.   Past Medical History:  Diagnosis Date  . Diabetes mellitus without complication (HCC)     No past surgical history on file.  Social History  Substance Use Topics  . Smoking status: Never Smoker  . Smokeless tobacco: Never Used  . Alcohol use No    No family history on file.  No Known Allergies  Medication list has been reviewed and updated.  Current Outpatient Prescriptions on File Prior to Visit  Medication Sig Dispense Refill  . metFORMIN (GLUCOPHAGE) 500 MG tablet Take 500 mg by mouth 2 (two) times daily with a meal.     No current facility-administered medications on file prior to visit.     ROS ROS otherwise unremarkable unless listed above.  Physical Examination: BP (!) 128/97 (BP Location: Left Arm, Patient Position: Sitting, Cuff Size: Normal)   Pulse (!) 138   Temp 99.9 F  (37.7 C) (Oral)   Resp 18   SpO2 94%  Ideal Body Weight:    Physical Exam  Constitutional: He is oriented to person, place, and time. He appears well-developed and well-nourished. No distress.  HENT:  Head: Normocephalic and atraumatic.  Eyes: Pupils are equal, round, and reactive to light. Conjunctivae and EOM are normal.  Cardiovascular: Normal rate.   Pulmonary/Chest: Effort normal. No respiratory distress. He has no decreased breath sounds. He has no wheezes.  He is a bit tachypneic, taking short bretahs due to pain.  No decreased breath sounds ausculated on the right side.   Tender along the lower right and axillary costal position.  No   Abdominal: Soft. Normal appearance.  Right sided abdominal pain at the lower quadrant  Musculoskeletal:  No tenderness along the calf pain.    Neurological: He is alert and oriented to person, place, and time.  Skin: Skin is warm and dry. He is not diaphoretic.  Psychiatric: He has a normal mood and affect. His behavior is normal.   Results for orders placed or performed in visit on 06/06/17  POCT CBC  Result Value Ref Range   WBC 14.9 (A) 4.6 - 10.2 K/uL   Lymph, poc 1.8 0.6 - 3.4  POC LYMPH PERCENT 12.1 10 - 50 %L   MID (cbc) 1.4 (A) 0 - 0.9   POC MID % 9.7 0 - 12 %M   POC Granulocyte 11.7 (A) 2 - 6.9   Granulocyte percent 78.2 37 - 80 %G   RBC 5.46 4.69 - 6.13 M/uL   Hemoglobin 14.2 14.1 - 18.1 g/dL   HCT, POC 16.143.5 09.643.5 - 53.7 %   MCV 79.6 (A) 80 - 97 fL   MCH, POC 26.1 (A) 27 - 31.2 pg   MCHC 32.8 31.8 - 35.4 g/dL   RDW, POC 04.514.1 %   Platelet Count, POC 209 142 - 424 K/uL   MPV 9.3 0 - 99.8 fL  POCT urinalysis dipstick  Result Value Ref Range   Color, UA yellow yellow   Clarity, UA clear clear   Glucose, UA >=1,000 (A) negative mg/dL   Bilirubin, UA negative negative   Ketones, POC UA large (80) (A) negative mg/dL   Spec Grav, UA 4.0981.020 1.1911.010 - 1.025   Blood, UA trace-intact (A) negative   pH, UA 5.5 5.0 - 8.0   Protein  Ur, POC =100 (A) negative mg/dL   Urobilinogen, UA 0.2 0.2 or 1.0 E.U./dL   Nitrite, UA Negative Negative   Leukocytes, UA Negative Negative  POCT glucose (manual entry)  Result Value Ref Range   POC Glucose 393 (A) 70 - 99 mg/dl   Dg Chest 2 View  Result Date: 06/06/2017 CLINICAL DATA:  Chest pain. EXAM: CHEST  2 VIEW COMPARISON:  Chest x-ray from same date. FINDINGS: The cardiomediastinal silhouette is normal in size. Normal pulmonary vascularity. Persistent low lung volumes with bibasilar atelectasis. No pneumothorax or large pleural effusion. No acute osseous abnormality. IMPRESSION: Low lung volumes with bibasilar atelectasis, unchanged. Electronically Signed   By: Obie DredgeWilliam T Derry M.D.   On: 06/06/2017 12:02   Assessment and Plan: Jon Hoffman is a 46 y.o. male who is here today  Chest pains, short of breath 25 Diff dx includes: dka, vs. Pulmonary embolism, appendicitis, pneumonia. He will need fluids,insulin, and possible ctA IV placed, for transport by EMS.    Chest pain, unspecified type - Plan: EKG 12-Lead, POCT CBC, POCT urinalysis dipstick, DG Chest 2 View, POCT glucose (manual entry), Insert peripheral IV, sodium chloride 0.9 % bolus 1,000 mL  Shortness of breath - Plan: DG Chest 2 View, POCT glucose (manual entry)  Trena PlattStephanie English, PA-C [facility] 9/13/201812:36 PM

## 2017-06-07 DIAGNOSIS — J189 Pneumonia, unspecified organism: Secondary | ICD-10-CM

## 2017-06-07 DIAGNOSIS — J984 Other disorders of lung: Secondary | ICD-10-CM

## 2017-06-07 DIAGNOSIS — J181 Lobar pneumonia, unspecified organism: Secondary | ICD-10-CM

## 2017-06-07 LAB — CBC
HCT: 38.4 % — ABNORMAL LOW (ref 39.0–52.0)
Hemoglobin: 12.4 g/dL — ABNORMAL LOW (ref 13.0–17.0)
MCH: 26.1 pg (ref 26.0–34.0)
MCHC: 32.3 g/dL (ref 30.0–36.0)
MCV: 80.7 fL (ref 78.0–100.0)
PLATELETS: 175 10*3/uL (ref 150–400)
RBC: 4.76 MIL/uL (ref 4.22–5.81)
RDW: 13.8 % (ref 11.5–15.5)
WBC: 10.8 10*3/uL — AB (ref 4.0–10.5)

## 2017-06-07 LAB — GLUCOSE, CAPILLARY
GLUCOSE-CAPILLARY: 257 mg/dL — AB (ref 65–99)
GLUCOSE-CAPILLARY: 230 mg/dL — AB (ref 65–99)
GLUCOSE-CAPILLARY: 236 mg/dL — AB (ref 65–99)
Glucose-Capillary: 254 mg/dL — ABNORMAL HIGH (ref 65–99)

## 2017-06-07 LAB — BASIC METABOLIC PANEL
ANION GAP: 8 (ref 5–15)
BUN: 11 mg/dL (ref 6–20)
CALCIUM: 7.9 mg/dL — AB (ref 8.9–10.3)
CHLORIDE: 106 mmol/L (ref 101–111)
CO2: 24 mmol/L (ref 22–32)
CREATININE: 1.03 mg/dL (ref 0.61–1.24)
GFR calc Af Amer: >60 mL/min (ref >60)
GFR calc non Af Amer: >60 mL/min (ref >60)
GLUCOSE: 246 mg/dL — AB (ref 65–99)
POTASSIUM: 3.8 mmol/L (ref 3.5–5.1)
SODIUM: 138 mmol/L (ref 135–145)

## 2017-06-07 LAB — EXPECTORATED SPUTUM ASSESSMENT W GRAM STAIN, RFLX TO RESP C

## 2017-06-07 LAB — HEMOGLOBIN A1C
HEMOGLOBIN A1C: 11.7 % — AB (ref 4.8–5.6)
Mean Plasma Glucose: 289.09 mg/dL

## 2017-06-07 LAB — LACTIC ACID, PLASMA: LACTIC ACID, VENOUS: 1 mmol/L (ref 0.5–1.9)

## 2017-06-07 LAB — HIV ANTIBODY (ROUTINE TESTING W REFLEX): HIV Screen 4th Generation wRfx: NONREACTIVE

## 2017-06-07 LAB — STREP PNEUMONIAE URINARY ANTIGEN: Strep Pneumo Urinary Antigen: NEGATIVE

## 2017-06-07 MED ORDER — PIPERACILLIN-TAZOBACTAM 3.375 G IVPB
3.3750 g | Freq: Three times a day (TID) | INTRAVENOUS | Status: DC
  Administered 2017-06-07: 3.375 g via INTRAVENOUS
  Filled 2017-06-07: qty 50

## 2017-06-07 MED ORDER — DEXTROSE 5 % IV SOLN
1.0000 g | INTRAVENOUS | Status: DC
  Administered 2017-06-07 – 2017-06-11 (×5): 1 g via INTRAVENOUS
  Filled 2017-06-07 (×6): qty 10

## 2017-06-07 MED ORDER — LIVING WELL WITH DIABETES BOOK
Freq: Once | Status: AC
  Administered 2017-06-07: 12:00:00
  Filled 2017-06-07: qty 1

## 2017-06-07 MED ORDER — INSULIN GLARGINE 100 UNIT/ML ~~LOC~~ SOLN
20.0000 [IU] | Freq: Every day | SUBCUTANEOUS | Status: DC
  Administered 2017-06-07 – 2017-06-10 (×4): 20 [IU] via SUBCUTANEOUS
  Filled 2017-06-07 (×4): qty 0.2

## 2017-06-07 MED ORDER — OXYCODONE HCL 5 MG PO TABS
5.0000 mg | ORAL_TABLET | Freq: Once | ORAL | Status: AC
  Administered 2017-06-07: 5 mg via ORAL
  Filled 2017-06-07: qty 1

## 2017-06-07 NOTE — Consult Note (Signed)
PULMONARY / CRITICAL CARE MEDICINE   Name: Jon Hoffman MRN: [id number] DOB: May 24, 1971    ADMISSION DATE:  06/06/2017 CONSULTATION DATE:  06/07/2017  REFERRING MD:  Dr. Alvino Chapel  CHIEF COMPLAINT:  fever  HISTORY OF PRESENT ILLNESS:   46 y/o male with diabetes mellitus presented to the [facility] emergency department with fever and right-sided chest pain. Found to have cavitary pneumonia. He states that he was in his usual state of health 1 week ago. However approximately one day prior to admission he had the sudden onset of right-sided chest pain associated with fever and cough productive of bloody mucus. He says that he has not experienced these symptoms over the last several weeks or months. He has not had weight loss, night sweats, or fevers or chills until this episode. 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 He denies any sort of TB contacts.   PAST MEDICAL HISTORY :  He  has a past medical history of Diabetes mellitus without complication (HCC).  PAST SURGICAL HISTORY: He  has no past surgical history on file.  No Known Allergies  No current facility-administered medications on file prior to encounter.    Current Outpatient Prescriptions on File Prior to Encounter  Medication Sig  . metFORMIN (GLUCOPHAGE) 500 MG tablet Take 500 mg by mouth 2 (two) times daily with a meal.    FAMILY HISTORY:  His has no family status information on file.    SOCIAL HISTORY: He  reports that he has never smoked. He has never used smokeless tobacco. He reports that he does not drink alcohol or use drugs.  REVIEW OF SYSTEMS:   Gen: per HPI HEENT: Denies blurred vision, double vision, hearing loss, tinnitus, sinus congestion, rhinorrhea, sore throat, neck stiffness, dysphagia PULM:per HPI CV: +chest pain, denies edema, orthopnea, paroxysmal nocturnal dyspnea, palpitations GI: Denies abdominal pain, nausea, vomiting, diarrhea, hematochezia, melena, constipation, change in bowel  habits GU: Denies dysuria, hematuria, polyuria, oliguria, urethral discharge Endocrine: Denies hot or cold intolerance, polyuria, polyphagia or appetite change Derm: Denies rash, dry skin, scaling or peeling skin change Heme: Denies easy bruising, bleeding, bleeding gums Neuro: Denies headache, numbness, weakness, slurred speech, loss of memory or consciousness   SUBJECTIVE:  As above  VITAL SIGNS: BP (!) 156/91 (BP Location: Right Arm)   Pulse (!) 109   Temp (!) 100.6 F (38.1 C) (Oral)   Resp (!) 26   Ht  (1.778 m)   Wt 207 lb 14.3 oz (94.3 kg)   SpO2 96%   BMI 29.83 kg/m   HEMODYNAMICS:    VENTILATOR SETTINGS:    INTAKE / OUTPUT: I/O last 3 completed shifts: In: 2991.7 [I.V.:2991.7] Out: 1000 [Urine:1000]  PHYSICAL EXAMINATION:  General:  Resting comfortably in bed HENT: NCAT OP clear PULM: Crackles R base, normal effort CV: RRR, no mgr GI: BS+, soft, nontender MSK: normal bulk and tone Neuro: awake, alert, no distress, MAEW  LABS:  BMET  Recent Labs Lab 06/06/17 1125 06/06/17 2251 06/07/17 0102  NA 136  --  138  K 4.0  --  3.8  CL 97*  --  106  CO2 26  --  24  BUN 12  --  11  CREATININE 1.21 1.13 1.03  GLUCOSE 372*  --  246*    Electrolytes  Recent Labs Lab 06/06/17 1125 06/07/17 0102  CALCIUM 8.9 7.9*    CBC  Recent Labs Lab 06/06/17 0947 06/06/17 1125 06/07/17 0102  WBC 14.9* 14.7* 10.8*  HGB 14.2 13.7 12.4*  HCT 43.5 40.7 38.4*  PLT  --  206 175    Coag's  Recent Labs Lab 06/06/17 2251  APTT 33  INR 1.05    Sepsis Markers  Recent Labs Lab 06/06/17 2057 06/06/17 2251 06/07/17 0102  LATICACIDVEN 1.11 1.6 1.0  PROCALCITON  --  1.17  --     ABG No results for input(s): PHART, PCO2ART, PO2ART in the last 168 hours.  Liver Enzymes  Recent Labs Lab 06/06/17 1125  AST 14*  ALT 20  ALKPHOS 78  BILITOT 1.1  ALBUMIN 3.6    Cardiac Enzymes No results for input(s): TROPONINI, PROBNP in the last 168  hours.  Glucose  Recent Labs Lab 06/06/17 1122 06/06/17 2108 06/06/17 2232 06/07/17 0744  GLUCAP 379* 290* 256* 257*    Imaging Dg Chest 2 View  Result Date: 06/06/2017 CLINICAL DATA:  Chest pain. EXAM: CHEST  2 VIEW COMPARISON:  Chest x-ray from same date. FINDINGS: The cardiomediastinal silhouette is normal in size. Normal pulmonary vascularity. Persistent low lung volumes with bibasilar atelectasis. No pneumothorax or large pleural effusion. No acute osseous abnormality. IMPRESSION: Low lung volumes with bibasilar atelectasis, unchanged. Electronically Signed   By: Obie Dredge M.D.   On: 06/06/2017 12:02   Ct Angio Chest Pe W/cm &/or Wo Cm  Result Date: 06/06/2017 CLINICAL DATA:  Right-sided chest pain since yesterday. EXAM: CT ANGIOGRAPHY CHEST WITH CONTRAST TECHNIQUE: Multidetector CT imaging of the chest was performed using the standard protocol during bolus administration of intravenous contrast. Multiplanar CT image reconstructions and MIPs were obtained to evaluate the vascular anatomy. CONTRAST:  100 cc Isovue 370 IV COMPARISON:  CXR from 06/06/2017 FINDINGS: Cardiovascular: Heart size is normal without pericardial effusion. No acute pulmonary embolus. No aortic aneurysm or dissection. Mediastinum/Nodes: No enlarged mediastinal, hilar, or axillary lymph nodes. Thyroid gland, trachea, and esophagus demonstrate no significant findings. Lungs/Pleura: Thinly septated cavitary abnormality in the periphery of the right lower lobe. The cavitation measures 2.2 x 1.3 x 1.5 cm, image 70/5 and axial image 58/11. Surrounding pneumonic consolidation is identified and findings may reflect a cavitary pneumonia versus a pneumonic consolidations surrounding bullous emphysematous change. Additional patchy airspace consolidations are seen along the posteromedial aspect of the right lower lobe with tubular filling defects noted within the right lower lobe bronchus. An endobronchial lesion causing post  obstructive change versus inspissated mucus are among some possibilities. There is a trace right pleural effusion. There is atelectasis in the left lower lobe, some of which appears streaky another slightly more nodular along the major fissure. No additional airspace opacities or dominant mass is identified. Upper Abdomen: Hepatic steatosis of the included liver. The adrenal glands are excluded on this study. Musculoskeletal: No chest wall abnormality. No acute or significant osseous findings. Review of the MIP images confirms the above findings. IMPRESSION: 1. Tubular filling defects within right lower lobe bronchi causing postobstructive pulmonary consolidations in the right lower lobe. Inspissated mucus or an endobronchial lesion are not excluded. 2. Along the right lateral basal segment of the lower lobe is a patchy pulmonary consolidation with central cavitation, thinly septated in appearance internally. Trace associated right pleural effusion. Differential possibilities may include a cavitary pneumonia or pneumonia outlining bullous emphysematous change. As neoplasm cannot be entirely excluded, repeat CT imaging after appropriate antibiotic therapy 3-4 weeks is recommended to assure improvement and/or resolution. 3. Lesser degree of parenchymal opacification/consolidation in the left lower lobe. 4. No acute pulmonary embolus. 5. Hepatic steatosis. Emphysema (ICD10-J43.9).  Electronically Signed   By: Tollie Eth M.D.   On: 06/06/2017 13:41     STUDIES:  06/06/2017 CT angiogram chest images independently reviewed showing a cavitary lesion and questionable hilar mass in the right lower lobe with surrounding atelectasis and groundglass, there is also consolidative opacity in the left lower lobe. No pulmonary embolism  CULTURES: Sputum culture September 14 AFB culture September 14 Blood culture September 13  ANTIBIOTICS: Zosyn September 13 Azithromycin September 13  SIGNIFICANT  EVENTS:   LINES/TUBES:   DISCUSSION: This is a 46 year old male with a past medical history significant for diabetes mellitus who presents with acute community-acquired pneumonia with cavitation. Given his occupation he is at increased risk for tuberculosis but he denies any specific contacts to his knowledge. If he had tuberculosis this would most likely be a primary infection considering the abrupt onset of symptoms. That said, the most likely diagnosis here is acute community-acquired pneumonia with cavitation. Given the tubular shaped abnormality, possible mass in the right lower lobe needs to have a bronchoscopy because the ddx includes bronchogenic carcinoma. Prior to bronchoscopy does need to have 3 sputum samples which are negative for AFB.    ASSESSMENT / PLAN:  PULMONARY A: Cavitary pneumonia ? RLL mass CAP P:   Rule out tuberculosis with 3 sputum AFB smears Tentatively plan for bronchoscopy on Monday, September 17 Continue azithromycin Could change to ceftriaxone from Zosyn, will defer to [facility] Agree with maintaining on respiratory isolation for now   Heber Marshall, MD  PCCM Pager: [phone] Cell: [phone] After 3pm or if no response, call [phone]   06/07/2017, 11:55 AM

## 2017-06-07 NOTE — Progress Notes (Signed)
Inpatient Diabetes Program Recommendations  AACE/ADA: New Consensus Statement on Inpatient Glycemic Control (2015)  Target Ranges:  Prepandial:   less than 140 mg/dL      Peak postprandial:   less than 180 mg/dL (1-2 hours)      Critically ill patients:  140 - 180 mg/dL   Lab Results  Component Value Date   GLUCAP 257 (H) 06/07/2017   HGBA1C 11.7 (H) 06/06/2017    Review of Glycemic Control Results for RAAHIL, ONG (MRN [id number]) as of 06/07/2017 11:22  Ref. Range 06/06/2017 11:22 06/06/2017 21:08 06/06/2017 22:32 06/07/2017 07:44  Glucose-Capillary Latest Ref Range: 65 - 99 mg/dL 308 (H) 657 (H) 846 (H) 257 (H)   Diabetes history: DM2 Outpatient Diabetes medications: Metformin 500 mg bid (not taking for several weeks) Current orders for Inpatient glycemic control: Novolog correction sensitive tid + 0-5 units hs  Inpatient Diabetes Program Recommendations:   Spoke with pt @ bedside about A1C 11.7 (average CBG 289 over the past 2-3 months) results with them and explained what an A1C is, basic pathophysiology of DM Type 2, basic home care, basic diabetes diet nutrition principles, importance of checking CBGs and maintaining good CBG control to prevent long-term and short-term complications. Reviewed signs and symptoms of hyperglycemia and hypoglycemia and how to treat hypoglycemia at home. Also reviewed blood sugar goals at home.  RNs to provide ongoing basic DM education at bedside with this patient. Have ordered educational booklet and DM videos. Have also placed RD consult for DM diet education for this patient.  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 States he is able to purchase his Metformin if he has a prescription. States he has never been on insulin in the past and prefers to try orals again if possible.  While in the hospital, Please consider: Lantus 15 units daily ( 0.2 units/kg = 19 units) Novolog 3 units tid meal coverage if eats 50%  Thank you, Darel Hong E.  Deandrae Wajda, RN, MSN, CDE  Diabetes Coordinator Inpatient Glycemic Control Team Team Pager [phone] (8am-5pm) 06/07/2017 11:28 AM

## 2017-06-07 NOTE — Progress Notes (Signed)
Pt declined to watch the educational videos about diabetes management.

## 2017-06-07 NOTE — Progress Notes (Signed)
PROGRESS NOTE    Jon Hoffman  WGN:[id number] DOB: 1971/04/03 DOA: 06/06/2017 PCP: Marva Panda, NP     Brief Narrative:  Jon Hoffman is a 46 y.o. male with medical history significant for diabetes mellitus, although he has been without his metformin for the past 2 weeks, but otherwise states he has been his usual state of health up until day prior to admission. Patient is originally from [place], but does a lot of traveling for his work. He drove 17 hours from [place] to [place] pretty much straight. He arrived in [place] and started having significant shortness of breath, cough, hemoptysis, and discomfort on the right side of his chest which he describes as an ache hurting worse when he sat up or leaned that way. He also felt feverish. In the emergency department, patient had leukocytosis of 14.9, fever of 101.5. He was tachycardic and hypoxemic. He underwent CTA of the chest which was negative for pulmonary embolus, but did know postobstructive pulmonary consolidation as well as cavitary lesion.  Assessment & Plan:   Principal Problem:   Sepsis (HCC) Active Problems:   Overweight (BMI 25.0-29.9)   CAP (community acquired pneumonia)   Uncontrolled type 2 diabetes mellitus with complication, without long-term current use of insulin (HCC)   Cavitary pneumonia   Sepsis secondary to cavitary pneumonia -Rule out TB with 3 acid-fast sputum cultures over the next 3 mornings, continue TB isolation -Pulmonology consulted, bronchoscopy next week -Continue Rocephin and Zithromax -Continue IV fluid  Diabetes mellitus type 2, uncontrolled with hyperglycemia -Hemoglobin A1c 11.7 -Start Lantus -Continue sliding scale insulin   DVT prophylaxis: lovenox Code Status: Full Family Communication: Family member at bedside Disposition Plan: Pending bronchoscopy next week   Consultants:   Pulmonology  Procedures:   None  Antimicrobials:  Anti-infectives    Start      Dose/Rate Route Frequency Ordered Stop   06/07/17 1500  cefTRIAXone (ROCEPHIN) 1 g in dextrose 5 % 50 mL IVPB     1 g 100 mL/hr over 30 Minutes Intravenous Every 24 hours 06/07/17 1415     06/07/17 1200  cefTRIAXone (ROCEPHIN) 1 g in dextrose 5 % 50 mL IVPB  Status:  Discontinued     1 g 100 mL/hr over 30 Minutes Intravenous Every 24 hours 06/06/17 2207 06/07/17 0801   06/07/17 1200  azithromycin (ZITHROMAX) 500 mg in dextrose 5 % 250 mL IVPB     500 mg 250 mL/hr over 60 Minutes Intravenous Every 24 hours 06/06/17 2207 06/14/17 1159   06/07/17 0830  piperacillin-tazobactam (ZOSYN) IVPB 3.375 g  Status:  Discontinued     3.375 g 12.5 mL/hr over 240 Minutes Intravenous Every 8 hours 06/07/17 0809 06/07/17 1415   06/06/17 1430  cefTRIAXone (ROCEPHIN) 1 g in dextrose 5 % 50 mL IVPB     1 g 100 mL/hr over 30 Minutes Intravenous  Once 06/06/17 1418 06/06/17 1642   06/06/17 1430  azithromycin (ZITHROMAX) 500 mg in dextrose 5 % 250 mL IVPB     500 mg 250 mL/hr over 60 Minutes Intravenous  Once 06/06/17 1418 06/06/17 1642       Subjective: Patient feeling well today, continues to have small amounts of hemoptysis. Feels mildly better since admission. Denies fevers, although his most recent temperature overnight has been 100.4. No chills or rigors, denies night sweats, weight loss. Continues to have right-sided chest pain as well as SOB. Denies any nausea, vomiting, abdominal pain.   Objective: Vitals:   06/06/17 2115 06/06/17 2130 06/06/17  2211 06/07/17 0532  BP: (!) 146/88 (!) 142/85 140/86 (!) 156/91  Pulse: (!) 108 (!) 105 (!) 109 (!) 109  Resp: (!) 26 (!) 40 (!) 30 (!) 26  Temp:   (!) 100.4 F (38 C) (!) 100.6 F (38.1 C)  TempSrc:   Oral Oral  SpO2: 95% 95% 95% 96%  Weight:   94.3 kg (207 lb 14.3 oz)   Height:    (1.778 m)     Intake/Output Summary (Last 24 hours) at 06/07/17 1421 Last data filed at 06/07/17 1000  Gross per 24 hour  Intake          3491.67 ml  Output              1000 ml  Net          2491.67 ml   Filed Weights   06/06/17 1122 06/06/17 2211  Weight: 88.9 kg (196 lb) 94.3 kg (207 lb 14.3 oz)    Examination:  General exam: Appears calm and comfortable  Respiratory system: Diminished breath sounds, crackles on the right. Respiratory effort normal. No acute distress Cardiovascular system: S1 & S2 heard, RRR. No JVD, murmurs, rubs, gallops or clicks. No pedal edema. Gastrointestinal system: Abdomen is nondistended, soft and nontender. No organomegaly or masses felt. Normal bowel sounds heard. Central nervous system: Alert and oriented. No focal neurological deficits. Extremities: Symmetric 5 x 5 power. Skin: No rashes, lesions or ulcers Psychiatry: Judgement and insight appear normal. Mood & affect appropriate.   Data Reviewed: I have personally reviewed following labs and imaging studies  CBC:  Recent Labs Lab 06/06/17 0947 06/06/17 1125 06/07/17 0102  WBC 14.9* 14.7* 10.8*  NEUTROABS  --  11.6*  --   HGB 14.2 13.7 12.4*  HCT 43.5 40.7 38.4*  MCV 79.6* 79.0 80.7  PLT  --  206 175   Basic Metabolic Panel:  Recent Labs Lab 06/06/17 1125 06/06/17 2251 06/07/17 0102  NA 136  --  138  K 4.0  --  3.8  CL 97*  --  106  CO2 26  --  24  GLUCOSE 372*  --  246*  BUN 12  --  11  CREATININE 1.21 1.13 1.03  CALCIUM 8.9  --  7.9*   GFR: Estimated Creatinine Clearance: 105.5 mL/min (by C-G formula based on SCr of 1.03 mg/dL). Liver Function Tests:  Recent Labs Lab 06/06/17 1125  AST 14*  ALT 20  ALKPHOS 78  BILITOT 1.1  PROT 8.1  ALBUMIN 3.6    Recent Labs Lab 06/06/17 1125  LIPASE 18   No results for input(s): AMMONIA in the last 168 hours. Coagulation Profile:  Recent Labs Lab 06/06/17 2251  INR 1.05   Cardiac Enzymes: No results for input(s): CKTOTAL, CKMB, CKMBINDEX, TROPONINI in the last 168 hours. BNP (last 3 results) No results for input(s): PROBNP in the last 8760 hours. HbA1C:  Recent Labs   06/06/17 2251  HGBA1C 11.7*   CBG:  Recent Labs Lab 06/06/17 1122 06/06/17 2108 06/06/17 2232 06/07/17 0744 06/07/17 1222  GLUCAP 379* 290* 256* 257* 230*   Lipid Profile: No results for input(s): CHOL, HDL, LDLCALC, TRIG, CHOLHDL, LDLDIRECT in the last 72 hours. Thyroid Function Tests: No results for input(s): TSH, T4TOTAL, FREET4, T3FREE, THYROIDAB in the last 72 hours. Anemia Panel: No results for input(s): VITAMINB12, FOLATE, FERRITIN, TIBC, IRON, RETICCTPCT in the last 72 hours. Sepsis Labs:  Recent Labs Lab 06/06/17 1610 06/06/17 2057 06/06/17 2251 06/07/17 0102  PROCALCITON  --   --  1.17  --   LATICACIDVEN 1.96* 1.11 1.6 1.0    Recent Results (from the past 240 hour(s))  Culture, blood (routine x 2)     Status: None (Preliminary result)   Collection Time: 06/06/17 12:39 PM  Result Value Ref Range Status   Specimen Description BLOOD RIGHT ANTECUBITAL  Final   Special Requests   Final    BOTTLES DRAWN AEROBIC AND ANAEROBIC Blood Culture adequate volume   Culture   Final    NO GROWTH < 24 HOURS Performed at [facility], [address], [place], [place] [address]    Report Status PENDING  Incomplete  Culture, blood (routine x 2)     Status: None (Preliminary result)   Collection Time: 06/06/17 12:44 PM  Result Value Ref Range Status   Specimen Description BLOOD RIGHT HAND  Final   Special Requests   Final    BOTTLES DRAWN AEROBIC AND ANAEROBIC Blood Culture adequate volume   Culture   Final    NO GROWTH < 24 HOURS Performed at [facility], [address] [place], [place] [address]    Report Status PENDING  Incomplete  Culture, sputum-assessment     Status: None   Collection Time: 06/07/17  5:38 AM  Result Value Ref Range Status   Specimen Description EXPECTORATED SPUTUM  Final   Special Requests NONE  Final   Sputum evaluation THIS SPECIMEN IS ACCEPTABLE FOR SPUTUM CULTURE  Final   Report Status 06/07/2017 FINAL  Final  Culture,  respiratory (NON-Expectorated)     Status: None (Preliminary result)   Collection Time: 06/07/17  5:38 AM  Result Value Ref Range Status   Specimen Description EXPECTORATED SPUTUM  Final   Special Requests NONE Reflexed from [id number]  Final   Gram Stain   Final    MODERATE WBC PRESENT, PREDOMINANTLY PMN RARE SQUAMOUS EPITHELIAL CELLS PRESENT RARE GRAM POSITIVE COCCI IN PAIRS Performed at [facility], [address] [place], [place] [address]    Culture PENDING  Incomplete   Report Status PENDING  Incomplete       Radiology Studies: Dg Chest 2 View  Result Date: 06/06/2017 CLINICAL DATA:  Chest pain. EXAM: CHEST  2 VIEW COMPARISON:  Chest x-ray from same date. FINDINGS: The cardiomediastinal silhouette is normal in size. Normal pulmonary vascularity. Persistent low lung volumes with bibasilar atelectasis. No pneumothorax or large pleural effusion. No acute osseous abnormality. IMPRESSION: Low lung volumes with bibasilar atelectasis, unchanged. Electronically Signed   By: Obie Dredge M.D.   On: 06/06/2017 12:02   Dg Chest 2 View  Result Date: 06/06/2017 CLINICAL DATA:  Chest pain. EXAM: CHEST  2 VIEW COMPARISON:  None. FINDINGS: The heart size and mediastinal contours are within normal limits. Hypoinflation of the lungs is noted with mild bibasilar subsegmental atelectasis. No pneumothorax or significant pleural effusion is noted. The visualized skeletal structures are unremarkable. IMPRESSION: Hypoinflation of the lungs with mild bibasilar subsegmental atelectasis. Electronically Signed   By: Lupita Raider, M.D.   On: 06/06/2017 09:58   Ct Angio Chest Pe W/cm &/or Wo Cm  Result Date: 06/06/2017 CLINICAL DATA:  Right-sided chest pain since yesterday. EXAM: CT ANGIOGRAPHY CHEST WITH CONTRAST TECHNIQUE: Multidetector CT imaging of the chest was performed using the standard protocol during bolus administration of intravenous contrast. Multiplanar CT image reconstructions and MIPs  were obtained to evaluate the vascular anatomy. CONTRAST:  100 cc Isovue 370 IV COMPARISON:  CXR from 06/06/2017 FINDINGS: Cardiovascular: Heart size is normal without pericardial effusion. No acute pulmonary embolus. No aortic aneurysm or dissection. Mediastinum/Nodes: No enlarged mediastinal, hilar, or axillary lymph nodes. Thyroid gland, trachea, and esophagus demonstrate no significant findings. Lungs/Pleura: Thinly septated cavitary abnormality in the periphery of the right lower lobe. The cavitation measures 2.2 x 1.3 x 1.5 cm, image 70/5 and axial image 58/11. Surrounding pneumonic consolidation is identified and findings may reflect a cavitary pneumonia versus a pneumonic consolidations surrounding bullous emphysematous change. Additional patchy airspace consolidations are seen along the posteromedial aspect of the right lower lobe with tubular filling defects noted within the right lower lobe bronchus. An endobronchial lesion causing post obstructive change versus inspissated mucus are among some possibilities. There is a trace right pleural effusion. There is atelectasis in the left lower lobe, some of which appears streaky another slightly more nodular along the major fissure. No additional airspace opacities or dominant mass is identified. Upper Abdomen: Hepatic steatosis of the included liver. The adrenal glands are excluded on this study. Musculoskeletal: No chest wall abnormality. No acute or significant osseous findings. Review of the MIP images confirms the above findings. IMPRESSION: 1. Tubular filling defects within right lower lobe bronchi causing postobstructive pulmonary consolidations in the right lower lobe. Inspissated mucus or an endobronchial lesion are not excluded. 2. Along the right lateral basal segment of the lower lobe is a patchy pulmonary consolidation with central cavitation, thinly septated in appearance internally. Trace associated right pleural effusion. Differential  possibilities may include a cavitary pneumonia or pneumonia outlining bullous emphysematous change. As neoplasm cannot be entirely excluded, repeat CT imaging after appropriate antibiotic therapy 3-4 weeks is recommended to assure improvement and/or resolution. 3. Lesser degree of parenchymal opacification/consolidation in the left lower lobe. 4. No acute pulmonary embolus. 5. Hepatic steatosis. Emphysema (ICD10-J43.9). Electronically Signed   By: Tollie Eth M.D.   On: 06/06/2017 13:41      Scheduled Meds: . enoxaparin (LOVENOX) injection  40 mg Subcutaneous QHS  . insulin aspart  0-5 Units Subcutaneous QHS  . insulin aspart  0-9 Units Subcutaneous TID WC  . insulin glargine  20 Units Subcutaneous QHS  . living well with diabetes book   Does not apply Once  . tuberculin  5 Units Intradermal Once   Continuous Infusions: . sodium chloride 1,000 mL (06/06/17 1349)  . sodium chloride 125 mL/hr at 06/06/17 2215  . azithromycin 500 mg (06/07/17 1400)  . cefTRIAXone (ROCEPHIN)  IV       LOS: 1 day    Time spent: 40 minutes   Noralee Stain, DO [facility] [email or web address] Password [email or web address] 06/07/2017, 2:21 PM

## 2017-06-07 NOTE — Progress Notes (Signed)
  RD consulted for nutrition education regarding diabetes.   Lab Results  Component Value Date   HGBA1C 11.7 (H) 06/06/2017    RD provided "Carbohydrate Counting for People with Diabetes" handout from the Academy of [employer] Dietetics. Discussed different food groups and their effects on blood sugar, emphasizing carbohydrate-containing foods. Provided list of carbohydrates and recommended serving sizes of common foods.  Discussed importance of controlled and consistent carbohydrate intake throughout the day. Provided examples of ways to balance meals/snacks and encouraged intake of high-fiber, whole grain complex carbohydrates. Teach back method used.  Expect fair compliance. Pt seems to have good understanding on how to eat balanced diet. Reports no excessive sweetened beverage consumption. Pt ran out of metformin. Discussed medication compliance post discharge.    Body mass index is 29.83 kg/m. Pt meets criteria for overweight based on current BMI.  Current diet order is carb modified, with no meal completions charted at this time. Labs and medications reviewed. No further nutrition interventions warranted at this time. RD contact information provided. If additional nutrition issues arise, please re-consult RD.  Vanessa Kick RD, LDN Clinical Nutrition Pager # [phone]

## 2017-06-07 NOTE — Progress Notes (Signed)
Pharmacy Antibiotic Note  Jon Hoffman is a 46 y.o. male admitted on 06/06/2017 with cavitary pneumonia/CAP.  Pharmacy has been consulted for zosyn dosing.  Plan: Zosyn 3.375g IV Q8H infused over 4hrs. Pharmacy will sign off as renal adjustment unlikely, CrCl > 18mls/min Please re-consult if assistance needed   Height:  (177.8 cm) Weight: 207 lb 14.3 oz (94.3 kg) IBW/kg (Calculated) : 73  Temp (24hrs), Avg:99.9 F (37.7 C), Min:98.1 F (36.7 C), Max:101.5 F (38.6 C)   Recent Labs Lab 06/06/17 0947 06/06/17 1125 06/06/17 1349 06/06/17 1610 06/06/17 2057 06/06/17 2251 06/07/17 0102  WBC 14.9* 14.7*  --   --   --   --  10.8*  CREATININE  --  1.21  --   --   --  1.13 1.03  LATICACIDVEN  --   --  2.08* 1.96* 1.11 1.6 1.0    Estimated Creatinine Clearance: 105.5 mL/min (by C-G formula based on SCr of 1.03 mg/dL).    No Known Allergies  Antimicrobials this admission: 9/13 azithromax >> 9/13 ceftriaxone x 1 9/14 zosyn >> Dose adjustments this admission:   Microbiology results: 9/13 BCx: sent 9/14 Sputum: sent   Thank you for allowing pharmacy to be a part of this patient's care.  Arley Phenix RPh 06/07/2017, 8:15 AM Pager [phone]

## 2017-06-08 ENCOUNTER — Encounter (HOSPITAL_COMMUNITY): Payer: Self-pay

## 2017-06-08 LAB — CBC WITH DIFFERENTIAL/PLATELET
Basophils Absolute: 0 10*3/uL (ref 0.0–0.1)
Basophils Relative: 0 %
Eosinophils Absolute: 0 10*3/uL (ref 0.0–0.7)
Eosinophils Relative: 0 %
HEMATOCRIT: 38.4 % — AB (ref 39.0–52.0)
HEMOGLOBIN: 12.7 g/dL — AB (ref 13.0–17.0)
LYMPHS ABS: 2.2 10*3/uL (ref 0.7–4.0)
LYMPHS PCT: 28 %
MCH: 26.3 pg (ref 26.0–34.0)
MCHC: 33.1 g/dL (ref 30.0–36.0)
MCV: 79.5 fL (ref 78.0–100.0)
MONOS PCT: 12 %
Monocytes Absolute: 1 10*3/uL (ref 0.1–1.0)
NEUTROS ABS: 4.7 10*3/uL (ref 1.7–7.7)
NEUTROS PCT: 59 %
Platelets: 176 10*3/uL (ref 150–400)
RBC: 4.83 MIL/uL (ref 4.22–5.81)
RDW: 13.6 % (ref 11.5–15.5)
WBC: 7.9 10*3/uL (ref 4.0–10.5)

## 2017-06-08 LAB — BASIC METABOLIC PANEL
Anion gap: 7 (ref 5–15)
BUN: 9 mg/dL (ref 6–20)
CHLORIDE: 105 mmol/L (ref 101–111)
CO2: 25 mmol/L (ref 22–32)
CREATININE: 0.95 mg/dL (ref 0.61–1.24)
Calcium: 8.1 mg/dL — ABNORMAL LOW (ref 8.9–10.3)
GFR calc Af Amer: >60 mL/min (ref >60)
GFR calc non Af Amer: >60 mL/min (ref >60)
Glucose, Bld: 204 mg/dL — ABNORMAL HIGH (ref 65–99)
Potassium: 3.7 mmol/L (ref 3.5–5.1)
Sodium: 137 mmol/L (ref 135–145)

## 2017-06-08 LAB — GLUCOSE, CAPILLARY
GLUCOSE-CAPILLARY: 154 mg/dL — AB (ref 65–99)
GLUCOSE-CAPILLARY: 281 mg/dL — AB (ref 65–99)
Glucose-Capillary: 178 mg/dL — ABNORMAL HIGH (ref 65–99)
Glucose-Capillary: 298 mg/dL — ABNORMAL HIGH (ref 65–99)

## 2017-06-08 NOTE — Progress Notes (Signed)
PROGRESS NOTE    Jon Hoffman  YPP:[id number] DOB: [birth year] DOA: 06/06/2017 PCP: Marva Panda, NP     Brief Narrative:  Jon Hoffman is a 47 y.o. male with medical history significant for diabetes mellitus, although he has been without his metformin for the past 2 weeks, but otherwise states he has been his usual state of health up until day prior to admission. Patient is originally from [place], but does a lot of traveling for his work. He drove 17 hours from [place] to [place] pretty much straight. He arrived in [place] and started having significant shortness of breath, cough, hemoptysis, and discomfort on the right side of his chest which he describes as an ache hurting worse when he sat up or leaned that way. He also felt feverish. In the emergency department, patient had leukocytosis of 14.9, fever of 101.5. He was tachycardic and hypoxemic. He underwent CTA of the chest which was negative for pulmonary embolus, but did know postobstructive pulmonary consolidation as well as cavitary lesion.  Assessment & Plan:   Principal Problem:   Sepsis (HCC) Active Problems:   Overweight (BMI 25.0-29.9)   CAP (community acquired pneumonia)   Uncontrolled type 2 diabetes mellitus with complication, without long-term current use of insulin (HCC)   Cavitary pneumonia   Sepsis secondary to cavitary pneumonia -Rule out TB with 3 acid-fast sputum cultures, continue TB isolation -Pulmonology consulted, bronchoscopy next week -Continue Rocephin and Zithromax  Diabetes mellitus type 2, uncontrolled with hyperglycemia -Hemoglobin A1c 11.7 -Start Lantus -Continue sliding scale insulin   DVT prophylaxis: lovenox Code Status: Full Family Communication: No family at bedside Disposition Plan: Pending bronchoscopy next week   Consultants:   Pulmonology  Procedures:   None  Antimicrobials:  Anti-infectives    Start     Dose/Rate Route Frequency Ordered Stop   06/07/17 1500   cefTRIAXone (ROCEPHIN) 1 g in dextrose 5 % 50 mL IVPB     1 g 100 mL/hr over 30 Minutes Intravenous Every 24 hours 06/07/17 1415     06/07/17 1200  cefTRIAXone (ROCEPHIN) 1 g in dextrose 5 % 50 mL IVPB  Status:  Discontinued     1 g 100 mL/hr over 30 Minutes Intravenous Every 24 hours 06/06/17 2207 06/07/17 0801   06/07/17 1200  azithromycin (ZITHROMAX) 500 mg in dextrose 5 % 250 mL IVPB     500 mg 250 mL/hr over 60 Minutes Intravenous Every 24 hours 06/06/17 2207 06/14/17 1159   06/07/17 0830  piperacillin-tazobactam (ZOSYN) IVPB 3.375 g  Status:  Discontinued     3.375 g 12.5 mL/hr over 240 Minutes Intravenous Every 8 hours 06/07/17 0809 06/07/17 1415   06/06/17 1430  cefTRIAXone (ROCEPHIN) 1 g in dextrose 5 % 50 mL IVPB     1 g 100 mL/hr over 30 Minutes Intravenous  Once 06/06/17 1418 06/06/17 1642   06/06/17 1430  azithromycin (ZITHROMAX) 500 mg in dextrose 5 % 250 mL IVPB     500 mg 250 mL/hr over 60 Minutes Intravenous  Once 06/06/17 1418 06/06/17 1642       Subjective: Patient feeling well today, continues to have cough, right-sided chest pain associated with cough. Breathing seems a little bit better. Continues to have low-grade fevers.    Objective: Vitals:   06/07/17 0532 06/07/17 1538 06/07/17 2203 06/08/17 0606  BP: (!) 156/91 (!) 155/94 (!) 142/98 (!) 134/92  Pulse: (!) 109 (!) 109 (!) 108 99  Resp: (!) 26 (!) Temp: (!) 100.6  F (38.1 C) (!) 100.4 F (38 C) 99.6 F (37.6 C) 100 F (37.8 C)  TempSrc: Oral Oral Oral Oral  SpO2: 96% 94% 97% 93%  Weight:      Height:        Intake/Output Summary (Last 24 hours) at 06/08/17 1158 Last data filed at 06/08/17 0859  Gross per 24 hour  Intake             3280 ml  Output             2950 ml  Net              330 ml   Filed Weights   06/06/17 1122 06/06/17 2211  Weight: 88.9 kg (196 lb) 94.3 kg (207 lb 14.3 oz)    Examination:  General exam: Appears calm and comfortable  Respiratory system:  Diminished breath sounds, crackles on the right. Respiratory effort normal. No acute distress Cardiovascular system: S1 & S2 heard, RRR. No JVD, murmurs, rubs, gallops or clicks. No pedal edema. Gastrointestinal system: Abdomen is nondistended, soft and nontender. No organomegaly or masses felt. Normal bowel sounds heard. Central nervous system: Alert and oriented. No focal neurological deficits. Extremities: Symmetric 5 x 5 power. Skin: No rashes, lesions or ulcers Psychiatry: Judgement and insight appear normal. Mood & affect appropriate.   Data Reviewed: I have personally reviewed following labs and imaging studies  CBC:  Recent Labs Lab 06/06/17 0947 06/06/17 1125 06/07/17 0102 06/08/17 0455  WBC 14.9* 14.7* 10.8* 7.9  NEUTROABS  --  11.6*  --  4.7  HGB 14.2 13.7 12.4* 12.7*  HCT 43.5 40.7 38.4* 38.4*  MCV 79.6* 79.0 80.7 79.5  PLT  --  206 175 176   Basic Metabolic Panel:  Recent Labs Lab 06/06/17 1125 06/06/17 2251 06/07/17 0102 06/08/17 0455  NA 136  --  138 137  K 4.0  --  3.8 3.7  CL 97*  --  106 105  CO2 26  --  24 25  GLUCOSE 372*  --  246* 204*  BUN 12  --  11 9  CREATININE 1.21 1.13 1.03 0.95  CALCIUM 8.9  --  7.9* 8.1*   GFR: Estimated Creatinine Clearance: 114.4 mL/min (by C-G formula based on SCr of 0.95 mg/dL). Liver Function Tests:  Recent Labs Lab 06/06/17 1125  AST 14*  ALT 20  ALKPHOS 78  BILITOT 1.1  PROT 8.1  ALBUMIN 3.6    Recent Labs Lab 06/06/17 1125  LIPASE 18   No results for input(s): AMMONIA in the last 168 hours. Coagulation Profile:  Recent Labs Lab 06/06/17 2251  INR 1.05   Cardiac Enzymes: No results for input(s): CKTOTAL, CKMB, CKMBINDEX, TROPONINI in the last 168 hours. BNP (last 3 results) No results for input(s): PROBNP in the last 8760 hours. HbA1C:  Recent Labs  06/06/17 2251  HGBA1C 11.7*   CBG:  Recent Labs Lab 06/07/17 0744 06/07/17 1222 06/07/17 1645 06/07/17 2200 06/08/17 0747    GLUCAP 257* 230* 236* 254* 178*   Lipid Profile: No results for input(s): CHOL, HDL, LDLCALC, TRIG, CHOLHDL, LDLDIRECT in the last 72 hours. Thyroid Function Tests: No results for input(s): TSH, T4TOTAL, FREET4, T3FREE, THYROIDAB in the last 72 hours. Anemia Panel: No results for input(s): VITAMINB12, FOLATE, FERRITIN, TIBC, IRON, RETICCTPCT in the last 72 hours. Sepsis Labs:  Recent Labs Lab 06/06/17 1610 06/06/17 2057 06/06/17 2251 06/07/17 0102  PROCALCITON  --   --  1.17  --  LATICACIDVEN 1.96* 1.11 1.6 1.0    Recent Results (from the past 240 hour(s))  Culture, blood (routine x 2)     Status: None (Preliminary result)   Collection Time: 06/06/17 12:39 PM  Result Value Ref Range Status   Specimen Description BLOOD RIGHT ANTECUBITAL  Final   Special Requests   Final    BOTTLES DRAWN AEROBIC AND ANAEROBIC Blood Culture adequate volume   Culture   Final    NO GROWTH 2 DAYS Performed at [facility], [address], [place], [place] [address]    Report Status PENDING  Incomplete  Culture, blood (routine x 2)     Status: None (Preliminary result)   Collection Time: 06/06/17 12:44 PM  Result Value Ref Range Status   Specimen Description BLOOD RIGHT HAND  Final   Special Requests   Final    BOTTLES DRAWN AEROBIC AND ANAEROBIC Blood Culture adequate volume   Culture   Final    NO GROWTH 2 DAYS Performed at [facility], [address], [place], [place] [address]    Report Status PENDING  Incomplete  Culture, sputum-assessment     Status: None   Collection Time: 06/07/17  5:38 AM  Result Value Ref Range Status   Specimen Description EXPECTORATED SPUTUM  Final   Special Requests NONE  Final   Sputum evaluation THIS SPECIMEN IS ACCEPTABLE FOR SPUTUM CULTURE  Final   Report Status 06/07/2017 FINAL  Final  Culture, respiratory (NON-Expectorated)     Status: None (Preliminary result)   Collection Time: 06/07/17  5:38 AM  Result Value Ref Range Status    Specimen Description EXPECTORATED SPUTUM  Final   Special Requests NONE Reflexed from [id number]  Final   Gram Stain   Final    MODERATE WBC PRESENT, PREDOMINANTLY PMN RARE SQUAMOUS EPITHELIAL CELLS PRESENT RARE GRAM POSITIVE COCCI IN PAIRS Performed at [facility] Lab, [address], [place], [place] [address]    Culture PENDING  Incomplete   Report Status PENDING  Incomplete       Radiology Studies: Ct Angio Chest Pe W/cm &/or Wo Cm  Result Date: 06/06/2017 CLINICAL DATA:  Right-sided chest pain since yesterday. EXAM: CT ANGIOGRAPHY CHEST WITH CONTRAST TECHNIQUE: Multidetector CT imaging of the chest was performed using the standard protocol during bolus administration of intravenous contrast. Multiplanar CT image reconstructions and MIPs were obtained to evaluate the vascular anatomy. CONTRAST:  100 cc Isovue 370 IV COMPARISON:  CXR from 06/06/2017 FINDINGS: Cardiovascular: Heart size is normal without pericardial effusion. No acute pulmonary embolus. No aortic aneurysm or dissection. Mediastinum/Nodes: No enlarged mediastinal, hilar, or axillary lymph nodes. Thyroid gland, trachea, and esophagus demonstrate no significant findings. Lungs/Pleura: Thinly septated cavitary abnormality in the periphery of the right lower lobe. The cavitation measures 2.2 x 1.3 x 1.5 cm, image 70/5 and axial image 58/11. Surrounding pneumonic consolidation is identified and findings may reflect a cavitary pneumonia versus a pneumonic consolidations surrounding bullous emphysematous change. Additional patchy airspace consolidations are seen along the posteromedial aspect of the right lower lobe with tubular filling defects noted within the right lower lobe bronchus. An endobronchial lesion causing post obstructive change versus inspissated mucus are among some possibilities. There is a trace right pleural effusion. There is atelectasis in the left lower lobe, some of which appears streaky another slightly more nodular  along the major fissure. No additional airspace opacities or dominant mass is identified. Upper Abdomen: Hepatic steatosis of the included liver. The adrenal  glands are excluded on this study. Musculoskeletal: No chest wall abnormality. No acute or significant osseous findings. Review of the MIP images confirms the above findings. IMPRESSION: 1. Tubular filling defects within right lower lobe bronchi causing postobstructive pulmonary consolidations in the right lower lobe. Inspissated mucus or an endobronchial lesion are not excluded. 2. Along the right lateral basal segment of the lower lobe is a patchy pulmonary consolidation with central cavitation, thinly septated in appearance internally. Trace associated right pleural effusion. Differential possibilities may include a cavitary pneumonia or pneumonia outlining bullous emphysematous change. As neoplasm cannot be entirely excluded, repeat CT imaging after appropriate antibiotic therapy 3-4 weeks is recommended to assure improvement and/or resolution. 3. Lesser degree of parenchymal opacification/consolidation in the left lower lobe. 4. No acute pulmonary embolus. 5. Hepatic steatosis. Emphysema (ICD10-J43.9). Electronically Signed   By: Tollie Eth M.D.   On: 06/06/2017 13:41      Scheduled Meds: . enoxaparin (LOVENOX) injection  40 mg Subcutaneous QHS  . insulin aspart  0-5 Units Subcutaneous QHS  . insulin aspart  0-9 Units Subcutaneous TID WC  . insulin glargine  20 Units Subcutaneous QHS  . tuberculin  5 Units Intradermal Once   Continuous Infusions: . azithromycin Stopped (06/07/17 1500)  . cefTRIAXone (ROCEPHIN)  IV Stopped (06/07/17 1648)     LOS: 2 days    Time spent: 30 minutes   Noralee Stain, DO [facility] [email or web address] Password [facility] 06/08/2017, 11:58 AM

## 2017-06-08 NOTE — Progress Notes (Signed)
TB skin test on right anterior forearm--NON reactive.  Charted follow up in flow sheet. Pt has one more AFB sputum collection pending. Pt aware to save sptum and collect early am. SRP, RN

## 2017-06-08 NOTE — Progress Notes (Signed)
Patient hasn't been able to produce any sputum for sample this morning. Sample cup at bedside for when he can, and instructed him to call when available.

## 2017-06-09 LAB — CBC WITH DIFFERENTIAL/PLATELET
Basophils Absolute: 0 10*3/uL (ref 0.0–0.1)
Basophils Relative: 0 %
EOS PCT: 1 %
Eosinophils Absolute: 0.1 10*3/uL (ref 0.0–0.7)
HCT: 40.7 % (ref 39.0–52.0)
Hemoglobin: 13.5 g/dL (ref 13.0–17.0)
LYMPHS ABS: 2 10*3/uL (ref 0.7–4.0)
LYMPHS PCT: 28 %
MCH: 26.1 pg (ref 26.0–34.0)
MCHC: 33.2 g/dL (ref 30.0–36.0)
MCV: 78.6 fL (ref 78.0–100.0)
MONO ABS: 1.1 10*3/uL — AB (ref 0.1–1.0)
MONOS PCT: 14 %
Neutro Abs: 4.1 10*3/uL (ref 1.7–7.7)
Neutrophils Relative %: 57 %
Platelets: 221 10*3/uL (ref 150–400)
RBC: 5.18 MIL/uL (ref 4.22–5.81)
RDW: 13.4 % (ref 11.5–15.5)
WBC: 7.3 10*3/uL (ref 4.0–10.5)

## 2017-06-09 LAB — CULTURE, RESPIRATORY

## 2017-06-09 LAB — BASIC METABOLIC PANEL
Anion gap: 6 (ref 5–15)
BUN: 8 mg/dL (ref 6–20)
CALCIUM: 8.6 mg/dL — AB (ref 8.9–10.3)
CO2: 26 mmol/L (ref 22–32)
Chloride: 106 mmol/L (ref 101–111)
Creatinine, Ser: 0.98 mg/dL (ref 0.61–1.24)
GFR calc Af Amer: >60 mL/min (ref >60)
GLUCOSE: 128 mg/dL — AB (ref 65–99)
Potassium: 3.3 mmol/L — ABNORMAL LOW (ref 3.5–5.1)
Sodium: 138 mmol/L (ref 135–145)

## 2017-06-09 LAB — CULTURE, RESPIRATORY W GRAM STAIN: Culture: NORMAL

## 2017-06-09 LAB — GLUCOSE, CAPILLARY
GLUCOSE-CAPILLARY: 145 mg/dL — AB (ref 65–99)
GLUCOSE-CAPILLARY: 151 mg/dL — AB (ref 65–99)
GLUCOSE-CAPILLARY: 249 mg/dL — AB (ref 65–99)
Glucose-Capillary: 301 mg/dL — ABNORMAL HIGH (ref 65–99)

## 2017-06-09 MED ORDER — POTASSIUM CHLORIDE CRYS ER 20 MEQ PO TBCR
40.0000 meq | EXTENDED_RELEASE_TABLET | Freq: Once | ORAL | Status: AC
  Administered 2017-06-09: 40 meq via ORAL
  Filled 2017-06-09: qty 2

## 2017-06-09 NOTE — Progress Notes (Signed)
PROGRESS NOTE    Jon Hoffman  XLK:[id number] DOB: 22-Jun-1971 DOA: 06/06/2017 PCP: Marva Panda, NP     Brief Narrative:  Jon Hoffman is a 46 y.o. male with medical history significant for diabetes mellitus, although he has been without his metformin for the past 2 weeks, but otherwise states he has been his usual state of health up until day prior to admission. Patient is originally from [place], but does a lot of traveling for his work. He drove 17 hours from [place] to [place] pretty much straight. He arrived in [place] and started having significant shortness of breath, cough, hemoptysis, and discomfort on the right side of his chest which he describes as an ache hurting worse when he sat up or leaned that way. He also felt feverish. In the emergency department, patient had leukocytosis of 14.9, fever of 101.5. He was tachycardic and hypoxemic. He underwent CTA of the chest which was negative for pulmonary embolus, but did know postobstructive pulmonary consolidation as well as cavitary lesion.  Assessment & Plan:   Principal Problem:   Sepsis (HCC) Active Problems:   Overweight (BMI 25.0-29.9)   CAP (community acquired pneumonia)   Uncontrolled type 2 diabetes mellitus with complication, without long-term current use of insulin (HCC)   Cavitary pneumonia   Sepsis secondary to cavitary pneumonia -Rule out TB with 3 acid-fast sputum cultures, continue TB isolation -Pulmonology consulted, bronchoscopy next week -Continue Rocephin and Zithromax  Diabetes mellitus type 2, uncontrolled with hyperglycemia -Hemoglobin A1c 11.7 -Start Lantus -Continue sliding scale insulin  Hypokalemia -Replace, trend    DVT prophylaxis: lovenox Code Status: Full Family Communication: No family at bedside Disposition Plan: Pending bronchoscopy next week   Consultants:   Pulmonology  Procedures:   None  Antimicrobials:  Anti-infectives    Start     Dose/Rate Route  Frequency Ordered Stop   06/07/17 1500  cefTRIAXone (ROCEPHIN) 1 g in dextrose 5 % 50 mL IVPB     1 g 100 mL/hr over 30 Minutes Intravenous Every 24 hours 06/07/17 1415     06/07/17 1200  cefTRIAXone (ROCEPHIN) 1 g in dextrose 5 % 50 mL IVPB  Status:  Discontinued     1 g 100 mL/hr over 30 Minutes Intravenous Every 24 hours 06/06/17 2207 06/07/17 0801   06/07/17 1200  azithromycin (ZITHROMAX) 500 mg in dextrose 5 % 250 mL IVPB     500 mg 250 mL/hr over 60 Minutes Intravenous Every 24 hours 06/06/17 2207 06/14/17 1159   06/07/17 0830  piperacillin-tazobactam (ZOSYN) IVPB 3.375 g  Status:  Discontinued     3.375 g 12.5 mL/hr over 240 Minutes Intravenous Every 8 hours 06/07/17 0809 06/07/17 1415   06/06/17 1430  cefTRIAXone (ROCEPHIN) 1 g in dextrose 5 % 50 mL IVPB     1 g 100 mL/hr over 30 Minutes Intravenous  Once 06/06/17 1418 06/06/17 1642   06/06/17 1430  azithromycin (ZITHROMAX) 500 mg in dextrose 5 % 250 mL IVPB     500 mg 250 mL/hr over 60 Minutes Intravenous  Once 06/06/17 1418 06/06/17 1642       Subjective: Patient feeling well today, continues to have cough, right-sided chest pain associated with cough. Breathing stable. He has no acute complaints or concerns and feeling well overall. Cough is more clear now and less bloody    Objective: Vitals:   06/08/17 1300 06/08/17 2056 06/09/17 0550 06/09/17 0553  BP: 140/90 (!) 148/91 (!) 151/96 (!) 146/92  Pulse: 99 (!) 101 94  Resp: Temp: 99.5 F (37.5 C) 100.3 F (37.9 C) 99.9 F (37.7 C)   TempSrc: Oral Oral Oral   SpO2: 95% 95% 98%   Weight:      Height:        Intake/Output Summary (Last 24 hours) at 06/09/17 1140 Last data filed at 06/09/17 0600  Gross per 24 hour  Intake              580 ml  Output             1100 ml  Net             -520 ml   Filed Weights   06/06/17 1122 06/06/17 2211  Weight: 88.9 kg (196 lb) 94.3 kg (207 lb 14.3 oz)    Examination:  General exam: Appears calm and  comfortable  Respiratory system: Crackles on the right. Respiratory effort normal. No acute distress Cardiovascular system: S1 & S2 heard, RRR. No JVD, murmurs, rubs, gallops or clicks. No pedal edema. Gastrointestinal system: Abdomen is nondistended, soft and nontender. No organomegaly or masses felt. Normal bowel sounds heard. Central nervous system: Alert and oriented. No focal neurological deficits. Extremities: Symmetric 5 x 5 power. Skin: No rashes, lesions or ulcers Psychiatry: Judgement and insight appear normal. Mood & affect appropriate.   Data Reviewed: I have personally reviewed following labs and imaging studies  CBC:  Recent Labs Lab 06/06/17 0947 06/06/17 1125 06/07/17 0102 06/08/17 0455 06/09/17 0451  WBC 14.9* 14.7* 10.8* 7.9 7.3  NEUTROABS  --  11.6*  --  4.7 4.1  HGB 14.2 13.7 12.4* 12.7* 13.5  HCT 43.5 40.7 38.4* 38.4* 40.7  MCV 79.6* 79.0 80.7 79.5 78.6  PLT  --  206 175 176 221   Basic Metabolic Panel:  Recent Labs Lab 06/06/17 1125 06/06/17 2251 06/07/17 0102 06/08/17 0455 06/09/17 0451  NA 136  --  138 137 138  K 4.0  --  3.8 3.7 3.3*  CL 97*  --  106 105 106  CO2 26  --  GLUCOSE 372*  --  246* 204* 128*  BUN 12  --  CREATININE 1.21 1.13 1.03 0.95 0.98  CALCIUM 8.9  --  7.9* 8.1* 8.6*   GFR: Estimated Creatinine Clearance: 110.9 mL/min (by C-G formula based on SCr of 0.98 mg/dL). Liver Function Tests:  Recent Labs Lab 06/06/17 1125  AST 14*  ALT 20  ALKPHOS 78  BILITOT 1.1  PROT 8.1  ALBUMIN 3.6    Recent Labs Lab 06/06/17 1125  LIPASE 18   No results for input(s): AMMONIA in the last 168 hours. Coagulation Profile:  Recent Labs Lab 06/06/17 2251  INR 1.05   Cardiac Enzymes: No results for input(s): CKTOTAL, CKMB, CKMBINDEX, TROPONINI in the last 168 hours. BNP (last 3 results) No results for input(s): PROBNP in the last 8760 hours. HbA1C:  Recent Labs  06/06/17 2251  HGBA1C 11.7*    CBG:  Recent Labs Lab 06/08/17 0747 06/08/17 1241 06/08/17 1657 06/08/17 2054 06/09/17 0730  GLUCAP 178* 154* 298* 281* 145*   Lipid Profile: No results for input(s): CHOL, HDL, LDLCALC, TRIG, CHOLHDL, LDLDIRECT in the last 72 hours. Thyroid Function Tests: No results for input(s): TSH, T4TOTAL, FREET4, T3FREE, THYROIDAB in the last 72 hours. Anemia Panel: No results for input(s): VITAMINB12, FOLATE, FERRITIN, TIBC, IRON, RETICCTPCT in the last 72 hours. Sepsis Labs:  Recent Labs Lab 06/06/17 1610 06/06/17  2057 06/06/17 2251 06/07/17 0102  PROCALCITON  --   --  1.17  --   LATICACIDVEN 1.96* 1.11 1.6 1.0    Recent Results (from the past 240 hour(s))  Culture, blood (routine x 2)     Status: None (Preliminary result)   Collection Time: 06/06/17 12:39 PM  Result Value Ref Range Status   Specimen Description BLOOD RIGHT ANTECUBITAL  Final   Special Requests   Final    BOTTLES DRAWN AEROBIC AND ANAEROBIC Blood Culture adequate volume   Culture   Final    NO GROWTH 3 DAYS Performed at [facility], [address], [place], [place] [address]    Report Status PENDING  Incomplete  Culture, blood (routine x 2)     Status: None (Preliminary result)   Collection Time: 06/06/17 12:44 PM  Result Value Ref Range Status   Specimen Description BLOOD RIGHT HAND  Final   Special Requests   Final    BOTTLES DRAWN AEROBIC AND ANAEROBIC Blood Culture adequate volume   Culture   Final    NO GROWTH 3 DAYS Performed at [facility], [address], [place], [place] [address]    Report Status PENDING  Incomplete  Culture, sputum-assessment     Status: None   Collection Time: 06/07/17  5:38 AM  Result Value Ref Range Status   Specimen Description EXPECTORATED SPUTUM  Final   Special Requests NONE  Final   Sputum evaluation THIS SPECIMEN IS ACCEPTABLE FOR SPUTUM CULTURE  Final   Report Status 06/07/2017 FINAL  Final  Culture, respiratory (NON-Expectorated)     Status:  None (Preliminary result)   Collection Time: 06/07/17  5:38 AM  Result Value Ref Range Status   Specimen Description EXPECTORATED SPUTUM  Final   Special Requests NONE Reflexed from [id number]  Final   Gram Stain   Final    MODERATE WBC PRESENT, PREDOMINANTLY PMN RARE SQUAMOUS EPITHELIAL CELLS PRESENT RARE GRAM POSITIVE COCCI IN PAIRS    Culture   Final    CULTURE REINCUBATED FOR BETTER GROWTH Performed at [facility], [address] [place], [place] [address]    Report Status PENDING  Incomplete       Radiology Studies: No results found.    Scheduled Meds: . enoxaparin (LOVENOX) injection  40 mg Subcutaneous QHS  . insulin aspart  0-5 Units Subcutaneous QHS  . insulin aspart  0-9 Units Subcutaneous TID WC  . insulin glargine  20 Units Subcutaneous QHS  . potassium chloride  40 mEq Oral Once   Continuous Infusions: . azithromycin 500 mg (06/09/17 1129)  . cefTRIAXone (ROCEPHIN)  IV Stopped (06/08/17 1436)     LOS: 3 days    Time spent: 30 minutes   Noralee Stain, DO [facility] [email or web address] Password [email or web address] 06/09/2017, 11:40 AM

## 2017-06-10 ENCOUNTER — Encounter (HOSPITAL_COMMUNITY): Payer: Self-pay | Admitting: Respiratory Therapy

## 2017-06-10 ENCOUNTER — Inpatient Hospital Stay (HOSPITAL_COMMUNITY): Payer: Self-pay

## 2017-06-10 ENCOUNTER — Encounter (HOSPITAL_COMMUNITY)
Admission: EM | Disposition: A | Discharge status: Home / Self Care | Payer: Self-pay | Source: Home / Self Care | Attending: Internal Medicine

## 2017-06-10 DIAGNOSIS — J984 Other disorders of lung: Secondary | ICD-10-CM

## 2017-06-10 HISTORY — PX: VIDEO BRONCHOSCOPY: SHX5072

## 2017-06-10 LAB — BASIC METABOLIC PANEL
Anion gap: 9 (ref 5–15)
BUN: 9 mg/dL (ref 6–20)
CHLORIDE: 102 mmol/L (ref 101–111)
CO2: 24 mmol/L (ref 22–32)
CREATININE: 0.95 mg/dL (ref 0.61–1.24)
Calcium: 8.7 mg/dL — ABNORMAL LOW (ref 8.9–10.3)
GFR calc Af Amer: >60 mL/min (ref >60)
GFR calc non Af Amer: >60 mL/min (ref >60)
Glucose, Bld: 199 mg/dL — ABNORMAL HIGH (ref 65–99)
Potassium: 4 mmol/L (ref 3.5–5.1)
SODIUM: 135 mmol/L (ref 135–145)

## 2017-06-10 LAB — GLUCOSE, CAPILLARY
GLUCOSE-CAPILLARY: 216 mg/dL — AB (ref 65–99)
Glucose-Capillary: 189 mg/dL — ABNORMAL HIGH (ref 65–99)
Glucose-Capillary: 327 mg/dL — ABNORMAL HIGH (ref 65–99)
Glucose-Capillary: 216 mg/dL — ABNORMAL HIGH (ref 65–99)

## 2017-06-10 LAB — CBC WITH DIFFERENTIAL/PLATELET
Basophils Absolute: 0 10*3/uL (ref 0.0–0.1)
Basophils Relative: 0 %
EOS ABS: 0.1 10*3/uL (ref 0.0–0.7)
EOS PCT: 1 %
HCT: 39.4 % (ref 39.0–52.0)
HEMOGLOBIN: 13.1 g/dL (ref 13.0–17.0)
LYMPHS ABS: 2.1 10*3/uL (ref 0.7–4.0)
Lymphocytes Relative: 30 %
MCH: 26.3 pg (ref 26.0–34.0)
MCHC: 33.2 g/dL (ref 30.0–36.0)
MCV: 79.1 fL (ref 78.0–100.0)
MONO ABS: 1 10*3/uL (ref 0.1–1.0)
MONOS PCT: 14 %
Neutro Abs: 3.9 10*3/uL (ref 1.7–7.7)
Neutrophils Relative %: 55 %
PLATELETS: 243 10*3/uL (ref 150–400)
RBC: 4.98 MIL/uL (ref 4.22–5.81)
RDW: 13.6 % (ref 11.5–15.5)
WBC: 7.1 10*3/uL (ref 4.0–10.5)

## 2017-06-10 LAB — SURGICAL PCR SCREEN
MRSA, PCR: NEGATIVE
Staphylococcus aureus: NEGATIVE

## 2017-06-10 LAB — ACID FAST SMEAR (AFB, MYCOBACTERIA): Acid Fast Smear: NEGATIVE

## 2017-06-10 LAB — ACID FAST SMEAR (AFB)

## 2017-06-10 SURGERY — BRONCHOSCOPY, WITH FLUOROSCOPY
Anesthesia: Moderate Sedation | Laterality: Bilateral

## 2017-06-10 MED ORDER — SODIUM CHLORIDE 0.9 % IV SOLN
INTRAVENOUS | Status: DC
Start: 2017-06-10 — End: 2017-06-12
  Administered 2017-06-10: 09:00:00 via INTRAVENOUS

## 2017-06-10 MED ORDER — DEXTROSE-NACL 5-0.9 % IV SOLN: INTRAVENOUS | Status: DC

## 2017-06-10 MED ORDER — BUTAMBEN-TETRACAINE-BENZOCAINE 2-2-14 % EX AERO
1.0000 | INHALATION_SPRAY | Freq: Once | CUTANEOUS | Status: DC
  Filled 2017-06-10: qty 20

## 2017-06-10 MED ORDER — MIDAZOLAM HCL 10 MG/2ML IJ SOLN
INTRAMUSCULAR | Status: DC | PRN
  Administered 2017-06-10 (×3): 2 mg via INTRAVENOUS

## 2017-06-10 MED ORDER — PHENYLEPHRINE HCL 0.25 % NA SOLN
1.0000 | Freq: Four times a day (QID) | NASAL | Status: DC | PRN
  Filled 2017-06-10: qty 15

## 2017-06-10 MED ORDER — LIDOCAINE HCL 2 % EX GEL
1.0000 "application " | Freq: Once | CUTANEOUS | Status: DC
  Filled 2017-06-10: qty 5

## 2017-06-10 MED ORDER — INSULIN ASPART 100 UNIT/ML ~~LOC~~ SOLN
3.0000 [IU] | Freq: Three times a day (TID) | SUBCUTANEOUS | Status: DC
  Administered 2017-06-10: 3 [IU] via SUBCUTANEOUS

## 2017-06-10 MED ORDER — FENTANYL CITRATE (PF) 100 MCG/2ML IJ SOLN
INTRAMUSCULAR | Status: AC
  Filled 2017-06-10: qty 4

## 2017-06-10 MED ORDER — GUAIFENESIN-DM 100-10 MG/5ML PO SYRP
5.0000 mL | ORAL_SOLUTION | ORAL | Status: DC | PRN
  Administered 2017-06-10 – 2017-06-12 (×4): 5 mL via ORAL
  Filled 2017-06-10 (×4): qty 10

## 2017-06-10 MED ORDER — LIDOCAINE HCL 1 % IJ SOLN
INTRAMUSCULAR | Status: DC | PRN
  Administered 2017-06-10: 6 mL via RESPIRATORY_TRACT

## 2017-06-10 MED ORDER — FENTANYL CITRATE (PF) 100 MCG/2ML IJ SOLN
INTRAMUSCULAR | Status: DC | PRN
Start: 2017-06-10 — End: 2017-06-10
  Administered 2017-06-10: 100 ug via INTRAVENOUS

## 2017-06-10 MED ORDER — MIDAZOLAM HCL 5 MG/ML IJ SOLN
INTRAMUSCULAR | Status: AC
  Filled 2017-06-10: qty 2

## 2017-06-10 NOTE — Progress Notes (Signed)
Video Bronchoscopy done  Intervention Bronchial washing done Intervention Bronchial brushing done  X 2 types

## 2017-06-10 NOTE — Progress Notes (Signed)
Inpatient Diabetes Program Recommendations  AACE/ADA: New Consensus Statement on Inpatient Glycemic Control (2015)  Target Ranges:  Prepandial:   less than 140 mg/dL      Peak postprandial:   less than 180 mg/dL (1-2 hours)      Critically ill patients:  140 - 180 mg/dL   Lab Results  Component Value Date   GLUCAP 216 (H) 06/10/2017   HGBA1C 11.7 (H) 06/06/2017    Review of Glycemic Control  HgbA1C - 11.7%. Will need to be discharged on affordable insulin since no insurance. Will need to f/u with PCP in [place].   Inpatient Diabetes Program Recommendations:   70/30 15 units bid - start 9/17 at 1700. Continue with Novolog 0-9 units tidwc and hs  Will follow.   Thank you. Ailene Ards, RD, LDN, CDE Inpatient Diabetes Coordinator [phone]

## 2017-06-10 NOTE — Op Note (Signed)
[facility] Cardiopulmonary Patient Name: Jon Hoffman Procedure Date: 06/10/2017 MRN: [id number] Attending MD: Chesley Mires , MD Date of Birth: 1971/02/26 CSN: [id number] Age: 46 Admit Type: Inpatient Ethnicity: Not Hispanic or Latino Procedure:            Bronchoscopy Indications:          Suspicious right lower lobe lesion Providers:            Chesley Mires MD, Marni Griffon ACNP, Andre Lefort                        RRT,RCP, Ashley Mariner RRT,RCP, Chesley Mires, MD Referring MD:          Medicines:            Midazolam 6 mg IV, Fentanyl 100 mcg IV, Lidocaine 2%                        applied to the tracheobronchial tree 6 mL Complications:        No immediate complications Estimated Blood Loss: None.                       Estimated blood loss: none. Procedure:      Pre-Anesthesia Assessment:      - Patient identification and proposed procedure were verified prior to       the procedure by the physician. The procedure was verified in the       endoscopy suite.      After obtaining informed consent, the bronchoscope was passed under       direct vision. Throughout the procedure, the patient's blood pressure,       pulse, and oxygen saturations were monitored continuously. the [id number]       ([id number]) scope was introduced through the mouth and advanced to the       tracheobronchial tree of both lungs. The procedure was accomplished       without difficulty. The patient tolerated the procedure well. The total       duration of the procedure was 25 minutes. Total fluoroscopy time was 1       second. Findings:      Respiratory tract: The larynx is normal. The vocal cords appear normal.       The subglottic space is normal. The trachea is of normal caliber. The       carina is sharp. The entire tracheobronchial tree was examined to at       least the first subsegmental level except for friability of airway       mucosa with minor bleeding with airway irritation from  bronchoscope.       Bronchial mucosa and anatomy otherwise are normal. The larynx is normal.      Bronchoalveolar lavage was performed in the right lower lobe of the lung       and sent for bacterial culture, AFB smear with culture, fungal smear and       culture, and cytology. 60 mL of fluid were instilled. 15 mL were       returned. The return was cloudy.      Transbronchial brushings obtained in the right lower lobe with a       cytology brush and sent for routine cytology and microbiology. Two       samples were obtained. Impression:      - Suspicious right lower lobe lesion.  Right lower lobe cavitary leasion.      - Bronchoalveolar lavage was performed.      - Transbronchial brushings were obtained. Moderate Sedation:      Moderate (conscious) sedation was personally administered by the       endoscopist. The following parameters were monitored: oxygen saturation,       heart rate, blood pressure, and response to care. Total physician       intraservice time was 22 minutes. Recommendation:      - Await BAL, brushing, culture and cytology results. Procedure Code(s):      --- Professional ---      [phone], Bronchoscopy, rigid or flexible, including fluoroscopic guidance,       when performed; with bronchial alveolar lavage      [phone], Bronchoscopy, rigid or flexible, including fluoroscopic guidance,       when performed; with brushing or protected brushings      [id number], Moderate sedation services provided by the same physician or       other qualified health care professional performing the diagnostic or       therapeutic service that the sedation supports, requiring the presence       of an independent trained observer to assist in the monitoring of the       patient's level of consciousness and physiological status; initial 15       minutes of intraservice time, patient age 45 years or older Diagnosis Code(s):      --- Professional ---      R91.8, Other nonspecific abnormal finding  of lung field CPT copyright 2016 [employer]. All rights reserved. The codes documented in this report are preliminary and upon coder review may  be revised to meet current compliance requirements. Chesley Mires, MD Chesley Mires, MD 06/10/2017 10:51:07 AM This report has been signed electronically. Number of Addenda: 0 Scope In: 10:13:19 AM Scope Out: 10:24:30 AM

## 2017-06-10 NOTE — Progress Notes (Signed)
PROGRESS NOTE    Jon Hoffman  YNW:[id number] DOB: [birth year] DOA: 06/06/2017 PCP: Everardo Beals, NP     Brief Narrative:  Jon Hoffman is a 46 y.o. male with medical history significant for diabetes mellitus, although he has been without his metformin for the past 2 weeks, but otherwise states he has been his usual state of health up until day prior to admission. Patient is originally from [place], but does a lot of traveling for his work. He drove 17 hours from [place] to [place] pretty much straight. He arrived in [place] and started having significant shortness of breath, cough, hemoptysis, and discomfort on the right side of his chest which he describes as an ache hurting worse when he sat up or leaned that way. He also felt feverish. In the emergency department, patient had leukocytosis of 14.9, fever of 101.5. He was tachycardic and hypoxemic. He underwent CTA of the chest which was negative for pulmonary embolus, but did show postobstructive pulmonary consolidation as well as cavitary lesion.  Assessment & Plan:   Principal Problem:   Sepsis ([place]) Active Problems:   Overweight (BMI 25.0-29.9)   CAP (community acquired pneumonia)   Uncontrolled type 2 diabetes mellitus with complication, without long-term current use of insulin ([place])   Cavitary pneumonia   Cavitating mass of lung   Sepsis secondary to cavitary pneumonia -Rule out TB with 3 acid-fast sputum cultures, continue TB isolation -Pulmonology consulted -S/p bronch 9/17. Await BAL, culture, brushing, cytology result  -Continue Rocephin and Zithromax  Diabetes mellitus type 2, uncontrolled with hyperglycemia -Hemoglobin A1c 11.7 -Lantus, sliding scale insulin -Remains hyperglycemic, add mealtime coverage    DVT prophylaxis: lovenox Code Status: Full Family Communication: No family at bedside Disposition Plan: Pending BAL results    Consultants:   Pulmonology  Procedures:   S/p bronch  9/17  Antimicrobials:  Anti-infectives    Start     Dose/Rate Route Frequency Ordered Stop   06/07/17 1500  cefTRIAXone (ROCEPHIN) 1 g in dextrose 5 % 50 mL IVPB     1 g 100 mL/hr over 30 Minutes Intravenous Every 24 hours 06/07/17 1415     06/07/17 1200  cefTRIAXone (ROCEPHIN) 1 g in dextrose 5 % 50 mL IVPB  Status:  Discontinued     1 g 100 mL/hr over 30 Minutes Intravenous Every 24 hours 06/06/17 2207 06/07/17 0801   06/07/17 1200  azithromycin (ZITHROMAX) 500 mg in dextrose 5 % 250 mL IVPB     500 mg 250 mL/hr over 60 Minutes Intravenous Every 24 hours 06/06/17 2207 06/14/17 1159   06/07/17 0830  piperacillin-tazobactam (ZOSYN) IVPB 3.375 g  Status:  Discontinued     3.375 g 12.5 mL/hr over 240 Minutes Intravenous Every 8 hours 06/07/17 0809 06/07/17 1415   06/06/17 1430  cefTRIAXone (ROCEPHIN) 1 g in dextrose 5 % 50 mL IVPB     1 g 100 mL/hr over 30 Minutes Intravenous  Once 06/06/17 1418 06/06/17 1642   06/06/17 1430  azithromycin (ZITHROMAX) 500 mg in dextrose 5 % 250 mL IVPB     500 mg 250 mL/hr over 60 Minutes Intravenous  Once 06/06/17 1418 06/06/17 1642       Subjective: Patient feeling well today, underwent bronch without issue. Continues to have intermittent hemoptysis but states it is not as frequent.    Objective: Vitals:   06/10/17 1110 06/10/17 1115 06/10/17 1120 06/10/17 1125  BP: (!) 141/99 (!) 145/98 (!) 139/102   Pulse:      Resp: Marland Kitchen)  30 (!) 36 (!) 31 (!) 27  Temp:      TempSrc:      SpO2: 94% 93% 93% 94%  Weight:      Height:        Intake/Output Summary (Last 24 hours) at 06/10/17 1314 Last data filed at 06/10/17 0850  Gross per 24 hour  Intake              550 ml  Output                0 ml  Net              550 ml   Filed Weights   06/06/17 1122 06/06/17 2211  Weight: 88.9 kg (196 lb) 94.3 kg (207 lb 14.3 oz)    Examination:  General exam: Appears calm and comfortable  Respiratory system: Crackles on the right. Respiratory effort  normal. No acute distress Cardiovascular system: S1 & S2 heard, RRR. No JVD, murmurs, rubs, gallops or clicks. No pedal edema. Gastrointestinal system: Abdomen is nondistended, soft and nontender. No organomegaly or masses felt. Normal bowel sounds heard. Central nervous system: Alert and oriented. No focal neurological deficits. Extremities: Symmetric 5 x 5 power. Skin: No rashes, lesions or ulcers Psychiatry: Judgement and insight appear normal. Mood & affect appropriate.   Data Reviewed: I have personally reviewed following labs and imaging studies  CBC:  Recent Labs Lab 06/06/17 1125 06/07/17 0102 06/08/17 0455 06/09/17 0451 06/10/17 0447  WBC 14.7* 10.8* 7.9 7.3 7.1  NEUTROABS 11.6*  --  4.7 4.1 3.9  HGB 13.7 12.4* 12.7* 13.5 13.1  HCT 40.7 38.4* 38.4* 40.7 39.4  MCV 79.0 80.7 79.5 78.6 79.1  PLT 206 175 176 221 962   Basic Metabolic Panel:  Recent Labs Lab 06/06/17 1125 06/06/17 2251 06/07/17 0102 06/08/17 0455 06/09/17 0451 06/10/17 0447  NA 136  --  138 137 138 135  K 4.0  --  3.8 3.7 3.3* 4.0  CL 97*  --  106 105 106 102  CO2 26  --  _0 GLUCOSE 372*  --  246* 204* 128* 199*  BUN 12  --  _1 CREATININE 1.21 1.13 1.03 0.95 0.98 0.95  CALCIUM 8.9  --  7.9* 8.1* 8.6* 8.7*   GFR: Estimated Creatinine Clearance: 114.4 mL/min (by C-G formula based on SCr of 0.95 mg/dL). Liver Function Tests:  Recent Labs Lab 06/06/17 1125  AST 14*  ALT 20  ALKPHOS 78  BILITOT 1.1  PROT 8.1  ALBUMIN 3.6    Recent Labs Lab 06/06/17 1125  LIPASE 18   No results for input(s): AMMONIA in the last 168 hours. Coagulation Profile:  Recent Labs Lab 06/06/17 2251  INR 1.05   Cardiac Enzymes: No results for input(s): CKTOTAL, CKMB, CKMBINDEX, TROPONINI in the last 168 hours. BNP (last 3 results) No results for input(s): PROBNP in the last 8760 hours. HbA1C: No results for input(s): HGBA1C in the last 72 hours. CBG:  Recent Labs Lab  06/09/17 1219 06/09/17 1650 06/09/17 2140 06/10/17 0740 06/10/17 1157  GLUCAP 301* 151* 249* 189* 216*   Lipid Profile: No results for input(s): CHOL, HDL, LDLCALC, TRIG, CHOLHDL, LDLDIRECT in the last 72 hours. Thyroid Function Tests: No results for input(s): TSH, T4TOTAL, FREET4, T3FREE, THYROIDAB in the last 72 hours. Anemia Panel: No results for input(s): VITAMINB12, FOLATE, FERRITIN, TIBC, IRON, RETICCTPCT in the last 72 hours. Sepsis Labs:  Recent Labs Lab 06/06/17  1610 06/06/17 2057 06/06/17 2251 06/07/17 0102  PROCALCITON  --   --  1.17  --   LATICACIDVEN 1.96* 1.11 1.6 1.0    Recent Results (from the past 240 hour(s))  Culture, blood (routine x 2)     Status: None (Preliminary result)   Collection Time: 06/06/17 12:39 PM  Result Value Ref Range Status   Specimen Description BLOOD RIGHT ANTECUBITAL  Final   Special Requests   Final    BOTTLES DRAWN AEROBIC AND ANAEROBIC Blood Culture adequate volume   Culture   Final    NO GROWTH 3 DAYS Performed at [facility], [place] [address] [place], [place] [address]    Report Status PENDING  Incomplete  Culture, blood (routine x 2)     Status: None (Preliminary result)   Collection Time: 06/06/17 12:44 PM  Result Value Ref Range Status   Specimen Description BLOOD RIGHT HAND  Final   Special Requests   Final    BOTTLES DRAWN AEROBIC AND ANAEROBIC Blood Culture adequate volume   Culture   Final    NO GROWTH 3 DAYS Performed at [facility], [place] [address], [place], [place] [address]    Report Status PENDING  Incomplete  Culture, sputum-assessment     Status: None   Collection Time: 06/07/17  5:38 AM  Result Value Ref Range Status   Specimen Description EXPECTORATED SPUTUM  Final   Special Requests NONE  Final   Sputum evaluation THIS SPECIMEN IS ACCEPTABLE FOR SPUTUM CULTURE  Final   Report Status 06/07/2017 FINAL  Final  Culture, respiratory (NON-Expectorated)     Status: None   Collection Time:  06/07/17  5:38 AM  Result Value Ref Range Status   Specimen Description EXPECTORATED SPUTUM  Final   Special Requests NONE Reflexed from [id number]  Final   Gram Stain   Final    MODERATE WBC PRESENT, PREDOMINANTLY PMN RARE SQUAMOUS EPITHELIAL CELLS PRESENT RARE GRAM POSITIVE COCCI IN PAIRS    Culture   Final    Consistent with normal respiratory flora. Performed at [facility], [place] [address], [place], [place] [address]    Report Status 06/09/2017 FINAL  Final  Acid Fast Smear (AFB)     Status: None   Collection Time: 06/08/17  5:00 AM  Result Value Ref Range Status   AFB Specimen Processing Concentration  Final   Acid Fast Smear Negative  Final    Comment: (NOTE) Performed At: [facility] [place], [place] [id number] Lindon Romp MD EH:[id number]    Source (AFB) SPUTUM  Final  Surgical PCR screen     Status: None   Collection Time: 06/09/17 10:30 PM  Result Value Ref Range Status   MRSA, PCR NEGATIVE NEGATIVE Final   Staphylococcus aureus NEGATIVE NEGATIVE Final    Comment: (NOTE) The Xpert SA Assay (FDA approved for NASAL specimens in patients 88 years of age and older), is one component of a comprehensive surveillance program. It is not intended to diagnose infection nor to guide or monitor treatment.        Radiology Studies: Dg C-arm Bronchoscopy  Result Date: 06/10/2017 C-ARM BRONCHOSCOPY: Fluoroscopy was utilized by the requesting physician.  No radiographic interpretation.      Scheduled Meds: . enoxaparin (LOVENOX) injection  40 mg Subcutaneous QHS  . insulin aspart  0-5 Units Subcutaneous QHS  . insulin aspart  0-9 Units Subcutaneous TID WC  . insulin glargine  20 Units Subcutaneous  QHS   Continuous Infusions: . sodium chloride 10 mL/hr at 06/10/17 0850  . azithromycin Stopped (06/09/17 1252)  . cefTRIAXone (ROCEPHIN)  IV Stopped (06/09/17 1750)  . dextrose 5 % and 0.9% NaCl       LOS: 4 days    Time spent: 30  minutes   Dessa Phi, DO [facility] [email or web address] Password [email or web address] 06/10/2017, 1:14 PM

## 2017-06-11 ENCOUNTER — Encounter (HOSPITAL_COMMUNITY): Payer: Self-pay | Admitting: Pulmonary Disease

## 2017-06-11 LAB — BASIC METABOLIC PANEL
Anion gap: 10 (ref 5–15)
BUN: 11 mg/dL (ref 6–20)
CALCIUM: 8.7 mg/dL — AB (ref 8.9–10.3)
CHLORIDE: 101 mmol/L (ref 101–111)
CO2: 26 mmol/L (ref 22–32)
CREATININE: 1.05 mg/dL (ref 0.61–1.24)
Glucose, Bld: 100 mg/dL — ABNORMAL HIGH (ref 65–99)
Potassium: 3.9 mmol/L (ref 3.5–5.1)
Sodium: 137 mmol/L (ref 135–145)

## 2017-06-11 LAB — CBC WITH DIFFERENTIAL/PLATELET
BASOS ABS: 0 10*3/uL (ref 0.0–0.1)
BASOS PCT: 0 %
Eosinophils Absolute: 0.1 10*3/uL (ref 0.0–0.7)
Eosinophils Relative: 1 %
HCT: 38.9 % — ABNORMAL LOW (ref 39.0–52.0)
HEMOGLOBIN: 13.3 g/dL (ref 13.0–17.0)
LYMPHS PCT: 20 %
Lymphs Abs: 2 10*3/uL (ref 0.7–4.0)
MCH: 26.4 pg (ref 26.0–34.0)
MCHC: 34.2 g/dL (ref 30.0–36.0)
MCV: 77.3 fL — ABNORMAL LOW (ref 78.0–100.0)
Monocytes Absolute: 1.5 10*3/uL — ABNORMAL HIGH (ref 0.1–1.0)
Monocytes Relative: 15 %
NEUTROS ABS: 6.4 10*3/uL (ref 1.7–7.7)
NEUTROS PCT: 64 %
Platelets: 261 10*3/uL (ref 150–400)
RBC: 5.03 MIL/uL (ref 4.22–5.81)
RDW: 13.5 % (ref 11.5–15.5)
WBC: 10 10*3/uL (ref 4.0–10.5)

## 2017-06-11 LAB — ACID FAST SMEAR (AFB, MYCOBACTERIA)

## 2017-06-11 LAB — GLUCOSE, CAPILLARY
GLUCOSE-CAPILLARY: 206 mg/dL — AB (ref 65–99)
GLUCOSE-CAPILLARY: 188 mg/dL — AB (ref 65–99)
Glucose-Capillary: 206 mg/dL — ABNORMAL HIGH (ref 65–99)
Glucose-Capillary: 202 mg/dL — ABNORMAL HIGH (ref 65–99)

## 2017-06-11 LAB — CULTURE, BLOOD (ROUTINE X 2)
Culture: NO GROWTH
Culture: NO GROWTH
SPECIAL REQUESTS: ADEQUATE
Special Requests: ADEQUATE

## 2017-06-11 LAB — ACID FAST SMEAR (AFB): ACID FAST SMEAR - AFSCU2: NEGATIVE

## 2017-06-11 MED ORDER — SENNOSIDES-DOCUSATE SODIUM 8.6-50 MG PO TABS: 1.0000 | ORAL_TABLET | Freq: Every evening | ORAL | Status: DC | PRN

## 2017-06-11 MED ORDER — INSULIN ASPART 100 UNIT/ML ~~LOC~~ SOLN
0.0000 [IU] | Freq: Every day | SUBCUTANEOUS | Status: DC
  Administered 2017-06-11: 2 [IU] via SUBCUTANEOUS

## 2017-06-11 MED ORDER — INSULIN ASPART 100 UNIT/ML ~~LOC~~ SOLN
0.0000 [IU] | Freq: Three times a day (TID) | SUBCUTANEOUS | Status: DC
  Administered 2017-06-11: 3 [IU] via SUBCUTANEOUS
  Administered 2017-06-11: 2 [IU] via SUBCUTANEOUS
  Administered 2017-06-11 – 2017-06-12 (×2): 3 [IU] via SUBCUTANEOUS
  Administered 2017-06-12: 2 [IU] via SUBCUTANEOUS

## 2017-06-11 MED ORDER — INSULIN ASPART PROT & ASPART (70-30 MIX) 100 UNIT/ML ~~LOC~~ SUSP
15.0000 [IU] | Freq: Two times a day (BID) | SUBCUTANEOUS | Status: DC
  Administered 2017-06-11 – 2017-06-12 (×4): 15 [IU] via SUBCUTANEOUS
  Filled 2017-06-11: qty 10

## 2017-06-11 MED ORDER — INSULIN ASPART 100 UNIT/ML ~~LOC~~ SOLN: 0.0000 [IU] | Freq: Three times a day (TID) | SUBCUTANEOUS | Status: DC

## 2017-06-11 NOTE — Progress Notes (Signed)
PCCM Progress Note  Admission Date: 06/06/2017 Consult Date: 06/07/2017 Referring Provider: Dr. Maylene Roes, [facility]  CC: Fever  Brief Description: 46 yo African immigrant male presented to [facility] with Rt sided chest pain and fever (101.5).  Found to have cavitary lesion in RLL.  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  PMHx of DM.  Subjective: Denies chest pain, or dyspnea.  Vital signs: BP (!) 142/86 (BP Location: Right Arm)   Pulse (!) 107   Temp 99.1 F (37.3 C) (Oral)   Resp (!) 25   Ht _0  (1.778 m)   Wt 207 lb 14.3 oz (94.3 kg)   SpO2 95%   BMI 29.83 kg/m   Intake/output: I/O last 3 completed shifts: In: 620 [P.O.:320; I.V.:250; IV Piggyback:50] Out: -   General - pleasant Eyes - pupils reactive ENT - no sinus tenderness, no oral exudate, no LAN Cardiac - regular, no murmur Chest - no wheeze, rales Abd - soft, non tender Ext - no edema Skin - no rashes Neuro - normal strength Psych - normal mood   CMP Latest Ref Rng & Units 06/11/2017 06/10/2017 06/09/2017  Glucose 65 - 99 mg/dL 100(H) 199(H) 128(H)  BUN 6 - 20 mg/dL _1 Creatinine 0.61 - 1.24 mg/dL 1.05 0.95 0.98  Sodium 135 - 145 mmol/L 137 135 138  Potassium 3.5 - 5.1 mmol/L 3.9 4.0 3.3(L)  Chloride 101 - 111 mmol/L 101 102 106  CO2 22 - 32 mmol/L _2 Calcium 8.9 - 10.3 mg/dL 8.7(L) 8.7(L) 8.6(L)  Total Protein 6.5 - 8.1 g/dL - - -  Total Bilirubin 0.3 - 1.2 mg/dL - - -  Alkaline Phos 38 - 126 U/L - - -  AST 15 - 41 U/L - - -  ALT 17 - 63 U/L - - -    CBC Latest Ref Rng & Units 06/11/2017 06/10/2017 06/09/2017  WBC 4.0 - 10.5 K/uL 10.0 7.1 7.3  Hemoglobin 13.0 - 17.0 g/dL 13.3 13.1 13.5  Hematocrit 39.0 - 52.0 % 38.9(L) 39.4 40.7  Platelets 150 - 400 K/uL 261 243 221    CBG (last 3)   Recent Labs  06/10/17 1711 06/10/17 2156 06/11/17 0746  GLUCAP 327* 216* 206*    Studies: CT angio 9/13 >> RLL cavitary lesion, areas of GGO Bronch 9/17 >>  Cultures: Blood 9/13 >>  Sputum 9/14  >> oral flora Sputum AFB 9/14 >> Sputum AFB 9/15 >>  Sputum AFB 9/16 >>  BAL 9/17 >> BAL AFB 9/17 >> BAL Fungal 9/17 >>   Antibiotics: Zithromax 9/13 >> Rocephin 9/13 >> Zosyn 9/14 >> 9/14  Discussion: 46 yo male with fever and cavitary pneumonia.  Ruling out TB.  Assessment/plan:  Cavitary pneumonia. - continue Abx - f/u bronchoscopy results - keep in respiratory isolation pending AFB smear results >> if BAL smear negative, then can d/c isolation - f/u CXR intermittently   Chesley Mires, MD [place] 06/11/2017, 9:23 AM Pager:  [phone] After 3pm call: [phone]

## 2017-06-11 NOTE — Progress Notes (Signed)
PROGRESS NOTE    Jon Hoffman  OZY:[id number] DOB: 05-09-1971 DOA: 06/06/2017 PCP: Everardo Beals, NP     Brief Narrative:  Jon Hoffman is a 46 y.o. male with medical history significant for diabetes mellitus, although he has been without his metformin for the past 2 weeks, but otherwise states he has been his usual state of health up until day prior to admission. Patient is originally from [place], but does a lot of traveling for his work. He drove 17 hours from [place] to [place] pretty much straight. He arrived in [place] and started having significant shortness of breath, cough, hemoptysis, and discomfort on the right side of his chest which he describes as an ache hurting worse when he sat up or leaned that way. He also felt feverish. In the emergency department, patient had leukocytosis of 14.9, fever of 101.5. He was tachycardic and hypoxemic. He underwent CTA of the chest which was negative for pulmonary embolus, but did show postobstructive pulmonary consolidation as well as cavitary lesion. AFB was collected for 3 days to rule out TB. He also underwent bronchoscopy on 9/17.  Assessment & Plan:   Principal Problem:   Sepsis ([place]) Active Problems:   Overweight (BMI 25.0-29.9)   CAP (community acquired pneumonia)   Uncontrolled type 2 diabetes mellitus with complication, without long-term current use of insulin ([place])   Cavitary pneumonia   Cavitating mass of lung   Sepsis secondary to cavitary pneumonia -Rule out TB with 3 acid-fast sputum cultures, continue TB isolation -Pulmonology consulted -S/p bronch 9/17. Await BAL, culture, brushing, cytology result  -Continue Rocephin and Zithromax  Diabetes mellitus type 2, uncontrolled with hyperglycemia -Hemoglobin A1c 11.7 -Appreciate inpatient diabetes program coordinator -Started on NovoLog 70/30 15 units twice daily, sliding scale insulin, NovoLog   DVT prophylaxis: lovenox Code Status: Full Family  Communication: No family at bedside Disposition Plan: Pending BAL results    Consultants:   Pulmonology  Procedures:   S/p bronch 9/17  Antimicrobials:  Anti-infectives    Start     Dose/Rate Route Frequency Ordered Stop   06/07/17 1500  cefTRIAXone (ROCEPHIN) 1 g in dextrose 5 % 50 mL IVPB     1 g 100 mL/hr over 30 Minutes Intravenous Every 24 hours 06/07/17 1415     06/07/17 1200  cefTRIAXone (ROCEPHIN) 1 g in dextrose 5 % 50 mL IVPB  Status:  Discontinued     1 g 100 mL/hr over 30 Minutes Intravenous Every 24 hours 06/06/17 2207 06/07/17 0801   06/07/17 1200  azithromycin (ZITHROMAX) 500 mg in dextrose 5 % 250 mL IVPB     500 mg 250 mL/hr over 60 Minutes Intravenous Every 24 hours 06/06/17 2207 06/14/17 1159   06/07/17 0830  piperacillin-tazobactam (ZOSYN) IVPB 3.375 g  Status:  Discontinued     3.375 g 12.5 mL/hr over 240 Minutes Intravenous Every 8 hours 06/07/17 0809 06/07/17 1415   06/06/17 1430  cefTRIAXone (ROCEPHIN) 1 g in dextrose 5 % 50 mL IVPB     1 g 100 mL/hr over 30 Minutes Intravenous  Once 06/06/17 1418 06/06/17 1642   06/06/17 1430  azithromycin (ZITHROMAX) 500 mg in dextrose 5 % 250 mL IVPB     500 mg 250 mL/hr over 60 Minutes Intravenous  Once 06/06/17 1418 06/06/17 1642       Subjective: Patient feeling well today, has had intermittent hemoptysis but better. No complaints of worsening chest pain, shortness of breath. In good spirits today   Objective: Vitals:  06/10/17 1120 06/10/17 1125 06/10/17 2158 06/11/17 0446  BP: (!) 139/102  (!) 148/90 (!) 142/86  Pulse:   (!) 107 (!) 107  Resp: (!) 31 (!) 27 (!) 25 (!) 25  Temp:   99.4 F (37.4 C) 99.1 F (37.3 C)  TempSrc:   Oral Oral  SpO2: 93% 94% 95% 95%  Weight:      Height:        Intake/Output Summary (Last 24 hours) at 06/11/17 1359 Last data filed at 06/11/17 1219  Gross per 24 hour  Intake              860 ml  Output                0 ml  Net              860 ml   Filed Weights    06/06/17 1122 06/06/17 2211  Weight: 88.9 kg (196 lb) 94.3 kg (207 lb 14.3 oz)    Examination:  General exam: Appears calm and comfortable  Respiratory system: CTAB, minimal crackles on right. Respiratory effort normal. No acute distress Cardiovascular system: S1 & S2 heard, Tachycardic, regular rhythm. No JVD, murmurs, rubs, gallops or clicks. No pedal edema. Gastrointestinal system: Abdomen is nondistended, soft and nontender. No organomegaly or masses felt. Normal bowel sounds heard. Central nervous system: Alert and oriented. No focal neurological deficits. Extremities: Symmetric 5 x 5 power. Skin: No rashes, lesions or ulcers Psychiatry: Judgement and insight appear normal. Mood & affect appropriate.   Data Reviewed: I have personally reviewed following labs and imaging studies  CBC:  Recent Labs Lab 06/06/17 1125 06/07/17 0102 06/08/17 0455 06/09/17 0451 06/10/17 0447 06/11/17 0437  WBC 14.7* 10.8* 7.9 7.3 7.1 10.0  NEUTROABS 11.6*  --  4.7 4.1 3.9 6.4  HGB 13.7 12.4* 12.7* 13.5 13.1 13.3  HCT 40.7 38.4* 38.4* 40.7 39.4 38.9*  MCV 79.0 80.7 79.5 78.6 79.1 77.3*  PLT 206 175 176 221 243 353   Basic Metabolic Panel:  Recent Labs Lab 06/07/17 0102 06/08/17 0455 06/09/17 0451 06/10/17 0447 06/11/17 0437  NA 138 137 138 135 137  K 3.8 3.7 3.3* 4.0 3.9  CL 106 105 106 102 101  CO2 _0 GLUCOSE 246* 204* 128* 199* 100*  BUN _1 CREATININE 1.03 0.95 0.98 0.95 1.05  CALCIUM 7.9* 8.1* 8.6* 8.7* 8.7*   GFR: Estimated Creatinine Clearance: 103.5 mL/min (by C-G formula based on SCr of 1.05 mg/dL). Liver Function Tests:  Recent Labs Lab 06/06/17 1125  AST 14*  ALT 20  ALKPHOS 78  BILITOT 1.1  PROT 8.1  ALBUMIN 3.6    Recent Labs Lab 06/06/17 1125  LIPASE 18   No results for input(s): AMMONIA in the last 168 hours. Coagulation Profile:  Recent Labs Lab 06/06/17 2251  INR 1.05   Cardiac Enzymes: No results for input(s):  CKTOTAL, CKMB, CKMBINDEX, TROPONINI in the last 168 hours. BNP (last 3 results) No results for input(s): PROBNP in the last 8760 hours. HbA1C: No results for input(s): HGBA1C in the last 72 hours. CBG:  Recent Labs Lab 06/10/17 1157 06/10/17 1711 06/10/17 2156 06/11/17 0746 06/11/17 1155  GLUCAP 216* 327* 216* 206* 188*   Lipid Profile: No results for input(s): CHOL, HDL, LDLCALC, TRIG, CHOLHDL, LDLDIRECT in the last 72 hours. Thyroid Function Tests: No results for input(s): TSH, T4TOTAL, FREET4, T3FREE, THYROIDAB in the last 72 hours. Anemia Panel: No  results for input(s): VITAMINB12, FOLATE, FERRITIN, TIBC, IRON, RETICCTPCT in the last 72 hours. Sepsis Labs:  Recent Labs Lab 06/06/17 1610 06/06/17 2057 06/06/17 2251 06/07/17 0102  PROCALCITON  --   --  1.17  --   LATICACIDVEN 1.96* 1.11 1.6 1.0    Recent Results (from the past 240 hour(s))  Culture, blood (routine x 2)     Status: None (Preliminary result)   Collection Time: 06/06/17 12:39 PM  Result Value Ref Range Status   Specimen Description BLOOD RIGHT ANTECUBITAL  Final   Special Requests   Final    BOTTLES DRAWN AEROBIC AND ANAEROBIC Blood Culture adequate volume   Culture   Final    NO GROWTH 4 DAYS Performed at [facility], [address] [place], [place] [address]    Report Status PENDING  Incomplete  Culture, blood (routine x 2)     Status: None (Preliminary result)   Collection Time: 06/06/17 12:44 PM  Result Value Ref Range Status   Specimen Description BLOOD RIGHT HAND  Final   Special Requests   Final    BOTTLES DRAWN AEROBIC AND ANAEROBIC Blood Culture adequate volume   Culture   Final    NO GROWTH 4 DAYS Performed at [facility], [place] [address], [place], [place] [address]    Report Status PENDING  Incomplete  Culture, sputum-assessment     Status: None   Collection Time: 06/07/17  5:38 AM  Result Value Ref Range Status   Specimen Description EXPECTORATED SPUTUM  Final    Special Requests NONE  Final   Sputum evaluation THIS SPECIMEN IS ACCEPTABLE FOR SPUTUM CULTURE  Final   Report Status 06/07/2017 FINAL  Final  Culture, respiratory (NON-Expectorated)     Status: None   Collection Time: 06/07/17  5:38 AM  Result Value Ref Range Status   Specimen Description EXPECTORATED SPUTUM  Final   Special Requests NONE Reflexed from [id number]  Final   Gram Stain   Final    MODERATE WBC PRESENT, PREDOMINANTLY PMN RARE SQUAMOUS EPITHELIAL CELLS PRESENT RARE GRAM POSITIVE COCCI IN PAIRS    Culture   Final    Consistent with normal respiratory flora. Performed at [facility], [place] [address], [place], [place] [address]    Report Status 06/09/2017 FINAL  Final  Acid Fast Smear (AFB)     Status: None   Collection Time: 06/08/17  5:00 AM  Result Value Ref Range Status   AFB Specimen Processing Concentration  Final   Acid Fast Smear Negative  Final    Comment: (NOTE) Performed At: [facility] [place], [place] [id number] Lindon Romp MD RA:[id number]    Source (AFB) SPUTUM  Final  Surgical PCR screen     Status: None   Collection Time: 06/09/17 10:30 PM  Result Value Ref Range Status   MRSA, PCR NEGATIVE NEGATIVE Final   Staphylococcus aureus NEGATIVE NEGATIVE Final    Comment: (NOTE) The Xpert SA Assay (FDA approved for NASAL specimens in patients 61 years of age and older), is one component of a comprehensive surveillance program. It is not intended to diagnose infection nor to guide or monitor treatment.   Culture, bal-quantitative     Status: None (Preliminary result)   Collection Time: 06/10/17 10:20 AM  Result Value Ref Range Status   Specimen Description BRONCHIAL ALVEOLAR LAVAGE  Final   Special Requests Normal  Final   Gram Stain   Final  RARE WBC PRESENT, PREDOMINANTLY PMN NO ORGANISMS SEEN    Culture   Final    NO GROWTH < 24 HOURS Performed at [place] [address], [place], [place] [address]      Report Status PENDING  Incomplete  Culture, fungus without smear     Status: None (Preliminary result)   Collection Time: 06/10/17 10:20 AM  Result Value Ref Range Status   Specimen Description BRONCHIAL ALVEOLAR LAVAGE  Final   Special Requests Normal  Final   Culture   Final    NO FUNGUS ISOLATED AFTER 1 DAY Performed at [facility], [place] [address], [place], [place] [address]    Report Status PENDING  Incomplete       Radiology Studies: Dg C-arm Bronchoscopy  Result Date: 06/10/2017 C-ARM BRONCHOSCOPY: Fluoroscopy was utilized by the requesting physician.  No radiographic interpretation.      Scheduled Meds: . enoxaparin (LOVENOX) injection  40 mg Subcutaneous QHS  . insulin aspart  0-5 Units Subcutaneous QHS  . insulin aspart  0-9 Units Subcutaneous TID WC  . insulin aspart protamine- aspart  15 Units Subcutaneous BID WC   Continuous Infusions: . sodium chloride 10 mL/hr at 06/10/17 0850  . azithromycin Stopped (06/11/17 1319)  . cefTRIAXone (ROCEPHIN)  IV Stopped (06/10/17 1459)     LOS: 5 days    Time spent: 30 minutes   Dessa Phi, DO [facility] [email or web address] Password [email or web address] 06/11/2017, 1:59 PM

## 2017-06-12 ENCOUNTER — Telehealth: Payer: Self-pay | Admitting: Adult Health

## 2017-06-12 DIAGNOSIS — J189 Pneumonia, unspecified organism: Secondary | ICD-10-CM

## 2017-06-12 LAB — CBC WITH DIFFERENTIAL/PLATELET
BASOS PCT: 1 %
Basophils Absolute: 0.1 10*3/uL (ref 0.0–0.1)
EOS ABS: 0.2 10*3/uL (ref 0.0–0.7)
Eosinophils Relative: 2 %
HCT: 36.8 % — ABNORMAL LOW (ref 39.0–52.0)
Hemoglobin: 12.3 g/dL — ABNORMAL LOW (ref 13.0–17.0)
LYMPHS PCT: 25 %
Lymphs Abs: 2.8 10*3/uL (ref 0.7–4.0)
MCH: 26.4 pg (ref 26.0–34.0)
MCHC: 33.4 g/dL (ref 30.0–36.0)
MCV: 79 fL (ref 78.0–100.0)
MONO ABS: 1.7 10*3/uL — AB (ref 0.1–1.0)
Monocytes Relative: 15 %
NEUTROS ABS: 6.5 10*3/uL (ref 1.7–7.7)
Neutrophils Relative %: 57 %
PLATELETS: 276 10*3/uL (ref 150–400)
RBC: 4.66 MIL/uL (ref 4.22–5.81)
RDW: 13.6 % (ref 11.5–15.5)
WBC: 11.3 10*3/uL — ABNORMAL HIGH (ref 4.0–10.5)

## 2017-06-12 LAB — GLUCOSE, CAPILLARY
GLUCOSE-CAPILLARY: 164 mg/dL — AB (ref 65–99)
Glucose-Capillary: 121 mg/dL — ABNORMAL HIGH (ref 65–99)
Glucose-Capillary: 212 mg/dL — ABNORMAL HIGH (ref 65–99)

## 2017-06-12 LAB — ACID FAST SMEAR (AFB, MYCOBACTERIA): Acid Fast Smear: NEGATIVE

## 2017-06-12 LAB — BASIC METABOLIC PANEL
Anion gap: 10 (ref 5–15)
BUN: 11 mg/dL (ref 6–20)
CALCIUM: 8.9 mg/dL (ref 8.9–10.3)
CO2: 26 mmol/L (ref 22–32)
CREATININE: 0.94 mg/dL (ref 0.61–1.24)
Chloride: 102 mmol/L (ref 101–111)
GFR calc Af Amer: >60 mL/min (ref >60)
GFR calc non Af Amer: >60 mL/min (ref >60)
GLUCOSE: 100 mg/dL — AB (ref 65–99)
Potassium: 3.6 mmol/L (ref 3.5–5.1)
Sodium: 138 mmol/L (ref 135–145)

## 2017-06-12 LAB — ACID FAST SMEAR (AFB)

## 2017-06-12 MED ORDER — AMOXICILLIN-POT CLAVULANATE 875-125 MG PO TABS: 1.0000 | ORAL_TABLET | Freq: Two times a day (BID) | ORAL | 0 refills | Status: AC

## 2017-06-12 MED ORDER — INSULIN ASPART PROT & ASPART (70-30 MIX) 100 UNIT/ML ~~LOC~~ SUSP: 15.0000 [IU] | Freq: Two times a day (BID) | SUBCUTANEOUS | 0 refills | Status: AC

## 2017-06-12 MED ORDER — BLOOD GLUCOSE MONITOR KIT: PACK | 0 refills | Status: AC

## 2017-06-12 MED ORDER — AMOXICILLIN-POT CLAVULANATE 875-125 MG PO TABS
1.0000 | ORAL_TABLET | Freq: Two times a day (BID) | ORAL | Status: DC
  Administered 2017-06-12: 1 via ORAL
  Filled 2017-06-12: qty 1

## 2017-06-12 MED ORDER — AMOXICILLIN-POT CLAVULANATE 875-125 MG PO TABS: 1.0000 | ORAL_TABLET | Freq: Two times a day (BID) | ORAL | 0 refills | Status: DC

## 2017-06-12 MED ORDER — METFORMIN HCL 500 MG PO TABS: 500.0000 mg | ORAL_TABLET | Freq: Two times a day (BID) | ORAL | 0 refills | Status: AC

## 2017-06-12 MED ORDER — INSULIN ASPART PROT & ASPART (70-30 MIX) 100 UNIT/ML ~~LOC~~ SUSP: 15.0000 [IU] | Freq: Two times a day (BID) | SUBCUTANEOUS | 0 refills | Status: DC

## 2017-06-12 NOTE — Discharge Summary (Signed)
Physician Discharge Summary  Jon Hoffman OZH:[id number] DOB: 1970/12/01 DOA: 06/06/2017  PCP: Jon Beals, NP  Admit date: 06/06/2017 Discharge date: 06/12/2017  Time spent: over 30 minutes minutes  Recommendations for Outpatient Follow-up:  1. Follow up with pulmonology in 2 weeks (CXR at this time as well) 2. Follow up acid fast cultures (smears negative) 3. Follow up cytology 4. F/u response to augmentin 5. F/u outpatient CBC/BMP 6. Follow up BG on insulin  7. Repeat CT after antibiotics (3-4 weeks) recommended to ensure resolution of cavitation (see details in CT study below)  He lives in [place] and unfortunately will be flying back on Friday.  Discussed importance of f/u with PCP and pulmonology there (he notes he'll come back here if unable to follow up in [place] with pulmonology).  Discussed requesting his records from here as well for his new providers.  Pt phone number [phone].   Discharge Diagnoses:  Principal Problem:   Sepsis ([place]) Active Problems:   Overweight (BMI 25.0-29.9)   CAP (community acquired pneumonia)   Uncontrolled type 2 diabetes mellitus with complication, without long-term current use of insulin ([place])   Cavitary pneumonia   Cavitating mass of lung   Discharge Condition: stable  Diet recommendation: heart healthy  Filed Weights   06/06/17 1122 06/06/17 2211  Weight: 88.9 kg (196 lb) 94.3 kg (207 lb 14.3 oz)    History of present illness:  Jon Hoffman 46 y.o.malewith medical history significant for diabetes mellitus, although he has been without his metformin for the past 2 weeks, but otherwise states he has been his usual state of health up until day prior to admission. Patient is originally from [place], but does [place] lot of traveling for his work. He drove 17 hours from [place] to [place] pretty much straight. He arrived in [place] and started having significant shortness of breath, cough, hemoptysis, and discomfort on  the right side of his chest which he describes as an ache hurting worse when he sat up or leaned that way. He also felt feverish. In the emergency department, patient had leukocytosis of 14.9, fever of 101.5. He was tachycardic and hypoxemic. He underwent CTA of the chest which was negative for pulmonary embolus, but did show postobstructive pulmonary consolidation as well as cavitary lesion. AFB was collected for 3 days to rule out TB. He also underwent bronchoscopy on 9/17.  Hospital Course:  Sepsis secondary to cavitary pneumonia -Rule out TB with 3 acid-fast sputum cultures -> 9/15, 9/16 negative.  9/14 not yet resulted, but 9/17 BAL with negative acid fast smear, discussed with pulmonology who noted ok to d/c isolation at this time with negative acid fast smear after BAL.  -Pulmonology consulted -S/p bronch 9/17. Await BAL, culture, brushing, cytology result  -Continue Rocephin and Zithromax -> discharged on augmentin  Diabetes mellitus type 2, uncontrolled with hyperglycemia -Hemoglobin A1c 11.7 -Appreciate inpatient diabetes program coordinator -Started on NovoLog 70/30 15 units twice daily, sliding scale insulin, NovoLog   Procedures:  Bronchoscopy 9/17 (i.e. Studies not automatically included, echos, thoracentesis, etc; not x-rays)  Consultations:  pulmonology  Discharge Exam: Vitals:   06/12/17 0557 06/12/17 1500  BP: (!) 140/55 (!) 150/59  Pulse: (!) 101 95  Resp: (!) 21 20  Temp: 98.1 F (36.7 C) 98 F (36.7 C)  SpO2: 96% 99%   Feels ok.  CP on r side with coughing.  Denies significant SOB.    General: No acute distress. Cardiovascular: Heart sounds show [place] regular rate (HR ~100), and  rhythm. No gallops or rubs. No murmurs. No JVD. Lungs: R sided crackles.  Abdomen: Soft, nontender, nondistended with normal active bowel sounds. No masses. No hepatosplenomegaly. Neurological: Alert and oriented 3. Moves all extremities 4 with equal strength. Cranial nerves II  through XII grossly intact. Skin: Warm and dry. No rashes or lesions. Extremities: No clubbing or cyanosis. No edema. Pedal pulses 2+. Psychiatric: Mood and affect are normal. Insight and judgment are appropriate.  Discharge Instructions   Discharge Instructions    Call MD for:  difficulty breathing, headache or visual disturbances    Complete by:  As directed    Call MD for:  persistant nausea and vomiting    Complete by:  As directed    Call MD for:  temperature >100.4    Complete by:  As directed    Diet - low sodium heart healthy    Complete by:  As directed    Discharge instructions    Complete by:  As directed    You were seen with pneumonia.  Your "acid fast smears" were negative for tuberculosis.  The cultures are pending.  Please continue augmentin twice [place] day for 2 weeks.  You need to follow up with pulmonology in 2 weeks (either here or in [place]).  Please follow up with [place] primary care doctor within 1 week.  Please request your records from the hospital so that your primary will see the records as well.  We also started you on insulin while you were here.  Please resume your metformin when you go home.  Also, I've sent [place] prescription for insulin which you should continue.  Please check your blood sugar before meals and before bedtime.  If you have any low blood sugars (<70) or symptoms, please call your doctor and hold your insulin until you speak with your doctor.  Take your next dose of augmentin tonight around 10 PM, then start twice [place] day tomorrow.   Increase activity slowly    Complete by:  As directed    Nursing communication    Complete by:  As directed    Okay to discharge after he self administers PM insulin.     Discharge Medication List as of 06/12/2017  7:04 PM    START taking these medications   Details  blood glucose meter kit and supplies KIT Dispense based on patient and insurance preference. Use up to four times daily as directed. (FOR ICD-9 250.00,  250.01)., Print    amoxicillin-clavulanate (AUGMENTIN) 875-125 MG tablet Take 1 tablet by mouth every 12 (twelve) hours., Starting Wed 06/12/2017, Until Wed 06/26/2017, Print    insulin aspart protamine- aspart (NOVOLOG MIX 70/30) (70-30) 100 UNIT/ML injection Inject 0.15 mLs (15 Units total) into the skin 2 (two) times daily with Sasha Rueth meal., Starting Wed 06/12/2017, Print      CONTINUE these medications which have CHANGED   Details  metFORMIN (GLUCOPHAGE) 500 MG tablet Take 1 tablet (500 mg total) by mouth 2 (two) times daily with Lorimer Tiberio meal., Starting Wed 06/12/2017, Until Fri 07/12/2017, Print       No Known Allergies Follow-up Information    Parrett, Fonnie Mu, NP Follow up on 06/25/2017.   Specialty:  Pulmonary Disease Why:  check in at 1030 for cxr and appointment at 1045am Contact information: [address] [place] [address] [phone]            The results of significant diagnostics from this hospitalization (including imaging, microbiology, ancillary and laboratory) are listed  below for reference.    Significant Diagnostic Studies: Dg Chest 2 View  Result Date: 06/06/2017 CLINICAL DATA:  Chest pain. EXAM: CHEST  2 VIEW COMPARISON:  Chest x-ray from same date. FINDINGS: The cardiomediastinal silhouette is normal in size. Normal pulmonary vascularity. Persistent low lung volumes with bibasilar atelectasis. No pneumothorax or large pleural effusion. No acute osseous abnormality. IMPRESSION: Low lung volumes with bibasilar atelectasis, unchanged. Electronically Signed   By: Titus Dubin M.D.   On: 06/06/2017 12:02   Dg Chest 2 View  Result Date: 06/06/2017 CLINICAL DATA:  Chest pain. EXAM: CHEST  2 VIEW COMPARISON:  None. FINDINGS: The heart size and mediastinal contours are within normal limits. Hypoinflation of the lungs is noted with mild bibasilar subsegmental atelectasis. No pneumothorax or significant pleural effusion is noted. The visualized skeletal structures are  unremarkable. IMPRESSION: Hypoinflation of the lungs with mild bibasilar subsegmental atelectasis. Electronically Signed   By: Marijo Conception, M.D.   On: 06/06/2017 09:58   Ct Angio Chest Pe W/cm &/or Wo Cm  Result Date: 06/06/2017 CLINICAL DATA:  Right-sided chest pain since yesterday. EXAM: CT ANGIOGRAPHY CHEST WITH CONTRAST TECHNIQUE: Multidetector CT imaging of the chest was performed using the standard protocol during bolus administration of intravenous contrast. Multiplanar CT image reconstructions and MIPs were obtained to evaluate the vascular anatomy. CONTRAST:  100 cc Isovue 370 IV COMPARISON:  CXR from 06/06/2017 FINDINGS: Cardiovascular: Heart size is normal without pericardial effusion. No acute pulmonary embolus. No aortic aneurysm or dissection. Mediastinum/Nodes: No enlarged mediastinal, hilar, or axillary lymph nodes. Thyroid gland, trachea, and esophagus demonstrate no significant findings. Lungs/Pleura: Thinly septated cavitary abnormality in the periphery of the right lower lobe. The cavitation measures 2.2 x 1.3 x 1.5 cm, image 70/5 and axial image 58/11. Surrounding pneumonic consolidation is identified and findings may reflect Vershawn Westrup cavitary pneumonia versus Lavance Beazer pneumonic consolidations surrounding bullous emphysematous change. Additional patchy airspace consolidations are seen along the posteromedial aspect of the right lower lobe with tubular filling defects noted within the right lower lobe bronchus. An endobronchial lesion causing post obstructive change versus inspissated mucus are among some possibilities. There is Blakleigh Straw trace right pleural effusion. There is atelectasis in the left lower lobe, some of which appears streaky another slightly more nodular along the major fissure. No additional airspace opacities or dominant mass is identified. Upper Abdomen: Hepatic steatosis of the included liver. The adrenal glands are excluded on this study. Musculoskeletal: No chest wall abnormality. No  acute or significant osseous findings. Review of the MIP images confirms the above findings. IMPRESSION: 1. Tubular filling defects within right lower lobe bronchi causing postobstructive pulmonary consolidations in the right lower lobe. Inspissated mucus or an endobronchial lesion are not excluded. 2. Along the right lateral basal segment of the lower lobe is Bodhi Moradi patchy pulmonary consolidation with central cavitation, thinly septated in appearance internally. Trace associated right pleural effusion. Differential possibilities may include Lorry Furber cavitary pneumonia or pneumonia outlining bullous emphysematous change. As neoplasm cannot be entirely excluded, repeat CT imaging after appropriate antibiotic therapy 3-4 weeks is recommended to assure improvement and/or resolution. 3. Lesser degree of parenchymal opacification/consolidation in the left lower lobe. 4. No acute pulmonary embolus. 5. Hepatic steatosis. Emphysema (ICD10-J43.9). Electronically Signed   By: Ashley Royalty M.D.   On: 06/06/2017 13:41   Dg C-arm Bronchoscopy  Result Date: 06/10/2017 C-ARM BRONCHOSCOPY: Fluoroscopy was utilized by the requesting physician.  No radiographic interpretation.    Microbiology: Recent Results (from the past 240 hour(s))  Culture, blood (routine x 2)     Status: None   Collection Time: 06/06/17 12:39 PM  Result Value Ref Range Status   Specimen Description BLOOD RIGHT ANTECUBITAL  Final   Special Requests   Final    BOTTLES DRAWN AEROBIC AND ANAEROBIC Blood Culture adequate volume   Culture   Final    NO GROWTH 5 DAYS Performed at [facility], [address] [place], [place] [address]    Report Status 06/11/2017 FINAL  Final  Culture, blood (routine x 2)     Status: None   Collection Time: 06/06/17 12:44 PM  Result Value Ref Range Status   Specimen Description BLOOD RIGHT HAND  Final   Special Requests   Final    BOTTLES DRAWN AEROBIC AND ANAEROBIC Blood Culture adequate volume   Culture   Final     NO GROWTH 5 DAYS Performed at [facility], [place] [address] [place], [place] [address]    Report Status 06/11/2017 FINAL  Final  Culture, sputum-assessment     Status: None   Collection Time: 06/07/17  5:38 AM  Result Value Ref Range Status   Specimen Description EXPECTORATED SPUTUM  Final   Special Requests NONE  Final   Sputum evaluation THIS SPECIMEN IS ACCEPTABLE FOR SPUTUM CULTURE  Final   Report Status 06/07/2017 FINAL  Final  Culture, respiratory (NON-Expectorated)     Status: None   Collection Time: 06/07/17  5:38 AM  Result Value Ref Range Status   Specimen Description EXPECTORATED SPUTUM  Final   Special Requests NONE Reflexed from [id number]  Final   Gram Stain   Final    MODERATE WBC PRESENT, PREDOMINANTLY PMN RARE SQUAMOUS EPITHELIAL CELLS PRESENT RARE GRAM POSITIVE COCCI IN PAIRS    Culture   Final    Consistent with normal respiratory flora. Performed at [facility], [place] [address] [place], [place] [address]    Report Status 06/09/2017 FINAL  Final  Acid Fast Smear (AFB)     Status: None   Collection Time: 06/08/17  5:00 AM  Result Value Ref Range Status   AFB Specimen Processing Concentration  Final   Acid Fast Smear Negative  Final    Comment: (NOTE) Performed At: [facility] [place], [place] [id number] Lindon Romp MD [id number]    Source (AFB) SPUTUM  Final  Acid Fast Smear (AFB)     Status: None   Collection Time: 06/09/17  3:00 PM  Result Value Ref Range Status   AFB Specimen Processing Concentration  Final   Acid Fast Smear Negative  Final    Comment: (NOTE) Performed At: [facility] [place], [place] [id number] Lindon Romp MD ML:[id number]    Source (AFB) SPUTUM  Final  Surgical PCR screen     Status: None   Collection Time: 06/09/17 10:30 PM  Result Value Ref Range Status   MRSA, PCR NEGATIVE NEGATIVE Final   Staphylococcus aureus NEGATIVE NEGATIVE Final    Comment:  (NOTE) The Xpert SA Assay (FDA approved for NASAL specimens in patients 29 years of age and older), is one component of Jermey Closs comprehensive surveillance program. It is not intended to diagnose infection nor to guide or monitor treatment.   Culture, bal-quantitative     Status: None (Preliminary result)   Collection Time: 06/10/17 10:20 AM  Result Value Ref Range Status   Specimen Description BRONCHIAL ALVEOLAR LAVAGE  Final   Special Requests Normal  Final   Gram Stain   Final    RARE WBC PRESENT, PREDOMINANTLY PMN NO ORGANISMS SEEN    Culture   Final    CULTURE REINCUBATED FOR BETTER GROWTH Performed at [facility], [place] [address] [place], [place] [address]    Report Status PENDING  Incomplete  Acid Fast Smear (AFB)     Status: None   Collection Time: 06/10/17 10:20 AM  Result Value Ref Range Status   AFB Specimen Processing Concentration  Final   Acid Fast Smear Negative  Final    Comment: (NOTE) Performed At: [facility] [address] [place], [place] [id number] Lindon Romp MD NO:[id number]    Source (AFB) BRONCHIAL ALVEOLAR LAVAGE  Final  Culture, fungus without smear     Status: None (Preliminary result)   Collection Time: 06/10/17 10:20 AM  Result Value Ref Range Status   Specimen Description BRONCHIAL ALVEOLAR LAVAGE  Final   Special Requests Normal  Final   Culture   Final    NO FUNGUS ISOLATED AFTER 2 DAYS Performed at [facility], [address] [place], [place] [address]    Report Status PENDING  Incomplete     Labs: Basic Metabolic Panel:  Recent Labs Lab 06/08/17 0455 06/09/17 0451 06/10/17 0447 06/11/17 0437 06/12/17 0421  NA 137 138 135 137 138  K 3.7 3.3* 4.0 3.9 3.6  CL 105 106 102 101 102  CO2 _0 GLUCOSE 204* 128* 199* 100* 100*  BUN _1 CREATININE 0.95 0.98 0.95 1.05 0.94  CALCIUM 8.1* 8.6* 8.7* 8.7* 8.9   Liver Function Tests:  Recent Labs Lab 06/06/17 1125  AST 14*  ALT 20   ALKPHOS 78  BILITOT 1.1  PROT 8.1  ALBUMIN 3.6    Recent Labs Lab 06/06/17 1125  LIPASE 18   No results for input(s): AMMONIA in the last 168 hours. CBC:  Recent Labs Lab 06/08/17 0455 06/09/17 0451 06/10/17 0447 06/11/17 0437 06/12/17 0421  WBC 7.9 7.3 7.1 10.0 11.3*  NEUTROABS 4.7 4.1 3.9 6.4 6.5  HGB 12.7* 13.5 13.1 13.3 12.3*  HCT 38.4* 40.7 39.4 38.9* 36.8*  MCV 79.5 78.6 79.1 77.3* 79.0  PLT 176 221 243 261 276   Cardiac Enzymes: No results for input(s): CKTOTAL, CKMB, CKMBINDEX, TROPONINI in the last 168 hours. BNP: BNP (last 3 results) No results for input(s): BNP in the last 8760 hours.  ProBNP (last 3 results) No results for input(s): PROBNP in the last 8760 hours.  CBG:  Recent Labs Lab 06/11/17 1718 06/11/17 2154 06/12/17 0807 06/12/17 1146 06/12/17 1741  GLUCAP 206* 202* 121* 212* 164*       Signed:  Fayrene Helper MD.  [facility] 06/12/2017, 10:35 PM

## 2017-06-12 NOTE — Progress Notes (Signed)
Inpatient Diabetes Program Recommendations  AACE/ADA: New Consensus Statement on Inpatient Glycemic Control (2015)  Target Ranges:  Prepandial:   less than 140 mg/dL      Peak postprandial:   less than 180 mg/dL (1-2 hours)      Critically ill patients:  140 - 180 mg/dL   Lab Results  Component Value Date   GLUCAP 212 (H) 06/12/2017   HGBA1C 11.7 (H) 06/06/2017    Review of Glycemic Control  Spoke with patient about new diabetes diagnosis.  Discussed A1C results (11.7%) and explained what an A1C is and informed patient that his current A1C indicates an average glucose of 289 mg/dl over the past 2-3 months. Discussed basic pathophysiology of DM Type 2, basic home care, importance of checking CBGs and maintaining good CBG control to prevent long-term and short-term complications. Reviewed glucose and A1C goals and explained that patient will need to continue to  Reviewed signs and symptoms of hyperglycemia and hypoglycemia along with treatment for both. Discussed impact of nutrition, exercise, stress, sickness, and medications on diabetes control. Discussed Living Well with diabetes booklet and encouraged patient to read through entire book. Informed patient that he will be prescribed Novolin 70/30 since it is more affordable. Informed patient that Novolin 70/30 can be purchased at [facility] for $25 per vial.   RN to teach insulin administration. Pt will be able to give his own insulin (70/30 and Novolog) prior to discharge.   Discussed with RN and MD.  Thank you. Ailene Ards, RD, LDN, CDE Inpatient Diabetes Coordinator [phone]

## 2017-06-12 NOTE — Telephone Encounter (Signed)
CXR has been ordered.

## 2017-06-12 NOTE — Progress Notes (Signed)
PCCM Progress Note  Admission Date: 06/06/2017 Consult Date: 06/07/2017 Referring Provider: Dr. Maylene Roes, [facility]  CC: Fever  Brief Description: 46 yo African immigrant male presented to [facility] with Rt sided chest pain and fever (101.5).  Found to have cavitary lesion in RLL.  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  PMHx of DM.  Subjective: Still has mild right sided chest discomfort with cough, but much improved from admission.  Not having sputum, fever, or hemoptysis.  Vital signs: BP (!) 140/55 (BP Location: Right Arm)   Pulse (!) 101   Temp 98.1 F (36.7 C) (Oral)   Resp (!) 21   Ht _0  (1.778 m)   Wt 207 lb 14.3 oz (94.3 kg)   SpO2 96%   BMI 29.83 kg/m   Intake/output: I/O last 3 completed shifts: In: 7858 [P.O.:1160; IV Piggyback:300] Out: -   General - pleasant Eyes - pupils reactive ENT - no sinus tenderness, no oral exudate, no LAN Cardiac - regular, no murmur Chest - faint rales Rt based Abd - soft, non tender Ext - no edema Skin - no rashes Neuro - normal strength Psych - normal mood    CMP Latest Ref Rng & Units 06/12/2017 06/11/2017 06/10/2017  Glucose 65 - 99 mg/dL 100(H) 100(H) 199(H)  BUN 6 - 20 mg/dL _1 Creatinine 0.61 - 1.24 mg/dL 0.94 1.05 0.95  Sodium 135 - 145 mmol/L 138 137 135  Potassium 3.5 - 5.1 mmol/L 3.6 3.9 4.0  Chloride 101 - 111 mmol/L 102 101 102  CO2 22 - 32 mmol/L _2 Calcium 8.9 - 10.3 mg/dL 8.9 8.7(L) 8.7(L)  Total Protein 6.5 - 8.1 g/dL - - -  Total Bilirubin 0.3 - 1.2 mg/dL - - -  Alkaline Phos 38 - 126 U/L - - -  AST 15 - 41 U/L - - -  ALT 17 - 63 U/L - - -    CBC Latest Ref Rng & Units 06/12/2017 06/11/2017 06/10/2017  WBC 4.0 - 10.5 K/uL 11.3(H) 10.0 7.1  Hemoglobin 13.0 - 17.0 g/dL 12.3(L) 13.3 13.1  Hematocrit 39.0 - 52.0 % 36.8(L) 38.9(L) 39.4  Platelets 150 - 400 K/uL 276 261 243    CBG (last 3)   Recent Labs  06/11/17 1718 06/11/17 2154 06/12/17 0807  GLUCAP 206* 202* 121*    Studies: CT  angio 9/13 >> RLL cavitary lesion, areas of GGO Bronch 9/17 >> cytology negative  Cultures: Blood 9/13 >> negative Sputum 9/14 >> oral flora Sputum AFB 9/14 >> Sputum AFB 9/15 >>  Sputum AFB 9/16 >>  BAL 9/17 >> BAL AFB 9/17 >> Smear negative >>  BAL Fungal 9/17 >>   Antibiotics: Zithromax 9/13 >> Rocephin 9/13 >> Zosyn 9/14 >> 9/14  Discussion: 46 yo male with fever and cavitary pneumonia.  Ruling out TB.  Assessment/plan:  Cavitary pneumonia. - can transition to augmentin 875 mg for 2 weeks - needs f/u chest xray as outpt in 2 weeks - can d/c respiratory isolation  We have scheduled him for pulmonary follow up in 2 weeks.  He will try to get appointment with PCP in [place], and if he can do this he will then d/c appointment with [place].  If he is not able to get appointment in [place], then he will travel back to [place].  D/w Dr. Florene Glen.    Okay for d/c home from pulmonary standpoint.  Chesley Mires, MD [facility] Pulmonary/Critical Care 06/12/2017, 10:52 AM Pager:  [phone] After 3pm call: [phone]

## 2017-06-13 LAB — CULTURE, BAL-QUANTITATIVE W GRAM STAIN: Culture: 3000 — AB

## 2017-06-13 LAB — CULTURE, BAL-QUANTITATIVE: SPECIAL REQUESTS: NORMAL

## 2017-06-14 LAB — ACID FAST SMEAR (AFB, MYCOBACTERIA)

## 2017-06-14 LAB — ACID FAST SMEAR (AFB): ACID FAST SMEAR - AFSCU2: NEGATIVE

## 2017-06-24 NOTE — Addendum Note (Signed)
Addended by: Boone Master E on: 06/24/2017 12:31 PM   Modules accepted: Orders

## 2017-06-24 NOTE — Telephone Encounter (Signed)
Order changed to STAT so that results will be available for appt

## 2017-06-25 ENCOUNTER — Inpatient Hospital Stay: Payer: Self-pay | Admitting: Adult Health

## 2017-07-01 LAB — CULTURE, FUNGUS WITHOUT SMEAR: Special Requests: NORMAL

## 2017-07-22 LAB — ACID FAST CULTURE WITH REFLEXED SENSITIVITIES: ACID FAST CULTURE - AFSCU3: NEGATIVE

## 2017-07-24 LAB — ACID FAST CULTURE WITH REFLEXED SENSITIVITIES: ACID FAST CULTURE - AFSCU3: NEGATIVE

## 2017-07-24 LAB — ACID FAST CULTURE WITH REFLEXED SENSITIVITIES (MYCOBACTERIA): Acid Fast Culture: NEGATIVE

## 2017-08-05 LAB — ACID FAST CULTURE WITH REFLEXED SENSITIVITIES (MYCOBACTERIA): Acid Fast Culture: NEGATIVE

## 2017-12-23 ENCOUNTER — Encounter: Payer: Self-pay | Admitting: Physician Assistant

## 2019-04-16 IMAGING — CT CT ANGIO CHEST
2 of 7 series · 16 of 36 positions shown · IV contrast (isovue)
Comparison: CXR from 06/06/2017

CLINICAL DATA: Right-sided chest pain since yesterday.

EXAM:
CT ANGIOGRAPHY CHEST WITH CONTRAST
TECHNIQUE: Multidetector CT imaging of the chest was performed using the
standard protocol during bolus administration of intravenous
contrast. Multiplanar CT image reconstructions and MIPs were
obtained to evaluate the vascular anatomy.
CONTRAST:  100 cc Isovue 370 IV

[Series 5: coronal mpr · coronal · 0.48mm/px · 1 of 115 slices shown]
[im 58/115  mediastinal]
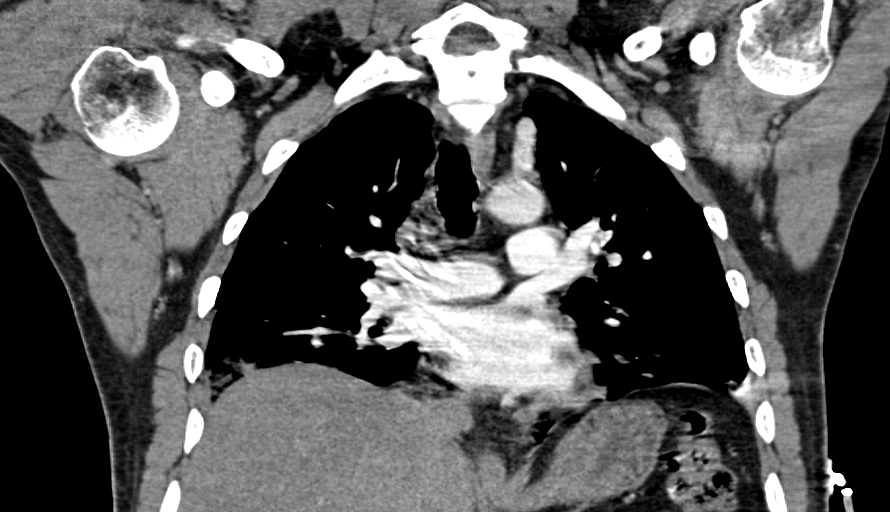

[Series 10: thins for pacs · axial · 0.77mm/px · z∈[-22,+192]mm · 15 of 246 slices shown]
[im 16/246  lung]
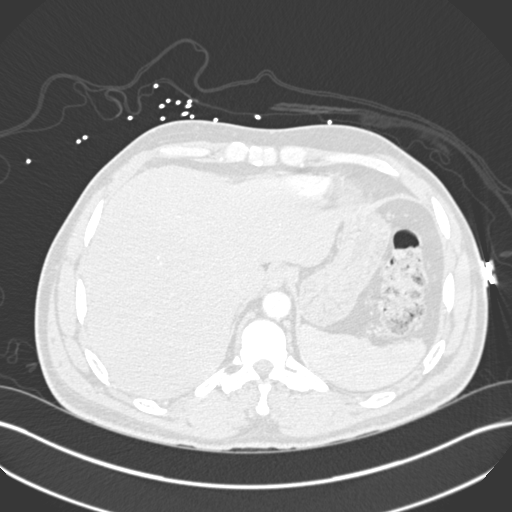
[im 31/246  mediastinal]
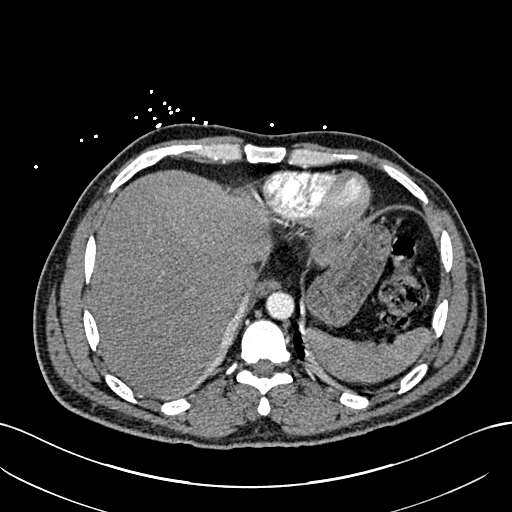
[im 46/246  lung]
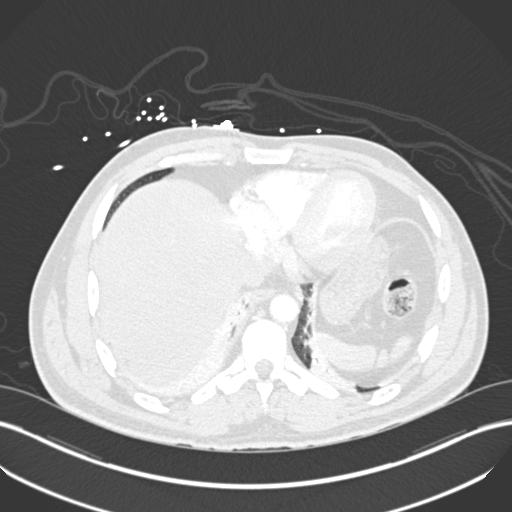
[im 62/246  mediastinal]
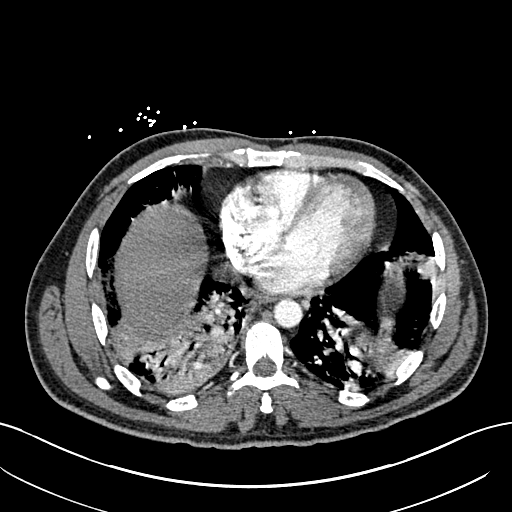
[im 77/246  lung]
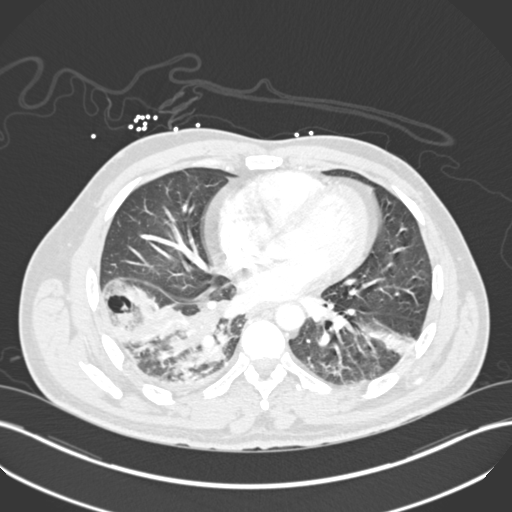
[im 92/246  mediastinal]
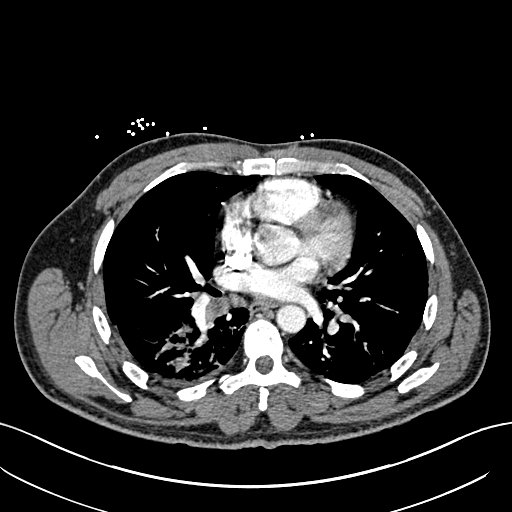
[im 108/246  lung]
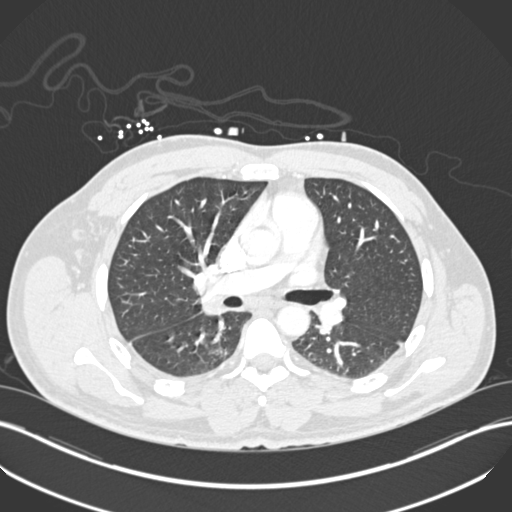
[im 123/246  mediastinal]
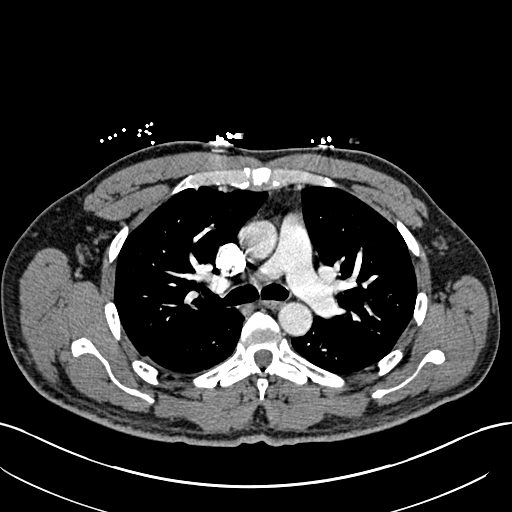
[im 138/246  lung]
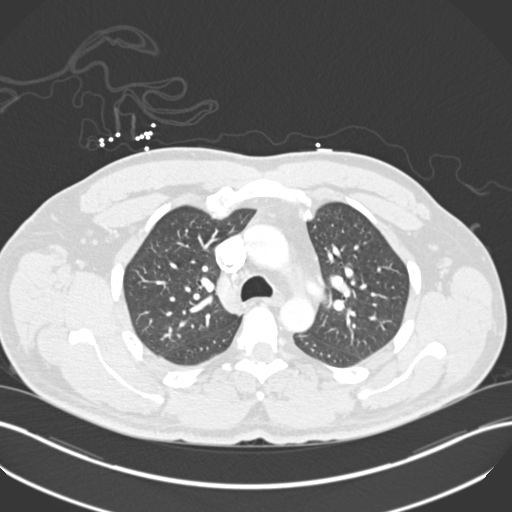
[im 154/246  mediastinal]
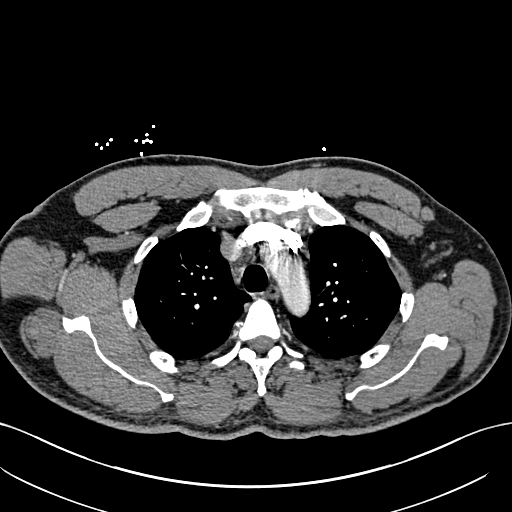
[im 169/246  lung]
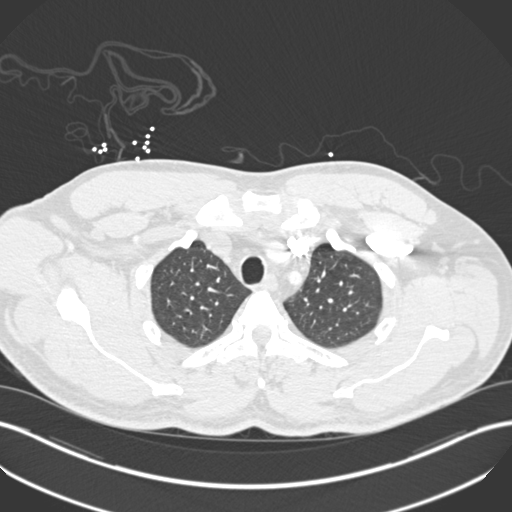
[im 184/246  mediastinal]
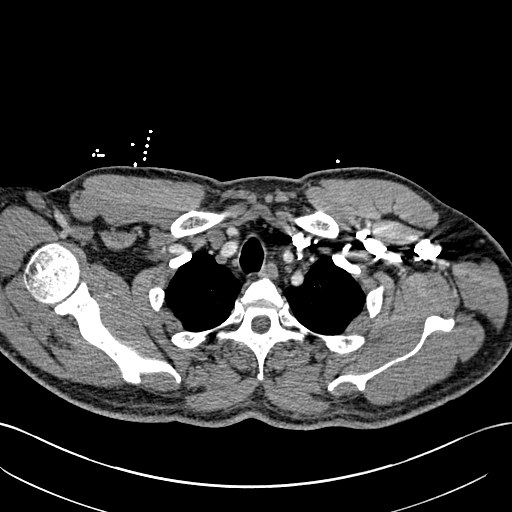
[im 200/246  lung]
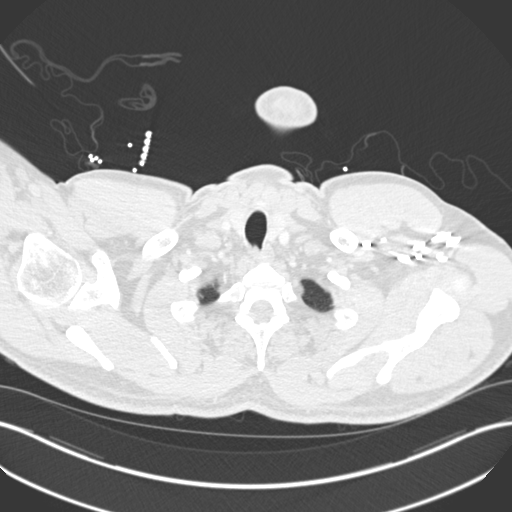
[im 215/246  mediastinal]
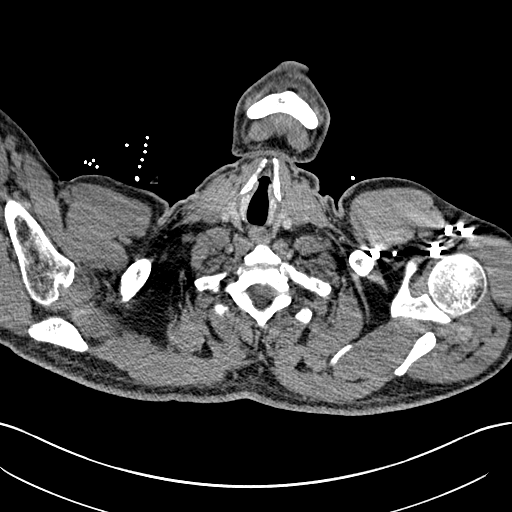
[im 230/246  lung]
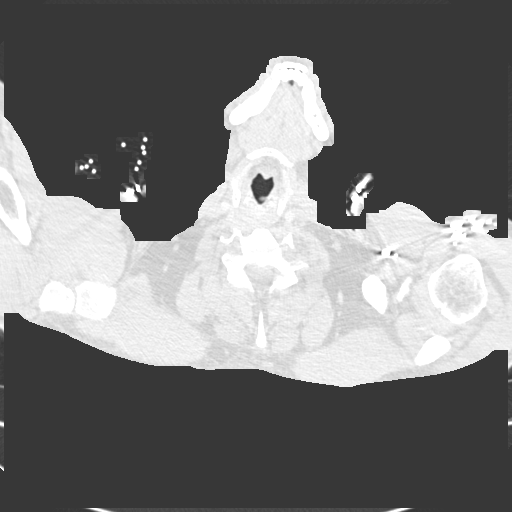

[16 of 36 positions shown; findings below may reference images not displayed]

FINDINGS: Cardiovascular: Heart size is normal without pericardial effusion.
No acute pulmonary embolus. No aortic aneurysm or dissection.

Mediastinum/Nodes: No enlarged mediastinal, hilar, or axillary lymph
nodes. Thyroid gland, trachea, and esophagus demonstrate no
significant findings.

Lungs/Pleura: Thinly septated cavitary abnormality in the periphery
of the right lower lobe. The cavitation measures 2.2 x 1.3 x 1.5 cm,
image 70/5 and axial image 58/11. Surrounding pneumonic
consolidation is identified and findings may reflect a cavitary
pneumonia versus a pneumonic consolidations surrounding bullous
emphysematous change. Additional patchy airspace consolidations are
seen along the posteromedial aspect of the right lower lobe with
tubular filling defects noted within the right lower lobe bronchus.
An endobronchial lesion causing post obstructive change versus
inspissated mucus are among some possibilities. There is a trace
right pleural effusion. There is atelectasis in the left lower lobe,
some of which appears streaky another slightly more nodular along
the major fissure. No additional airspace opacities or dominant mass
is identified.

Upper Abdomen: Hepatic steatosis of the included liver. The adrenal
glands are excluded on this study.

Musculoskeletal: No chest wall abnormality. No acute or significant
osseous findings.

Review of the MIP images confirms the above findings.
IMPRESSION: 1. Tubular filling defects within right lower lobe bronchi causing
postobstructive pulmonary consolidations in the right lower lobe.
Inspissated mucus or an endobronchial lesion are not excluded.
2. Along the right lateral basal segment of the lower lobe is a
patchy pulmonary consolidation with central cavitation, thinly
septated in appearance internally. Trace associated right pleural
effusion. Differential possibilities may include a cavitary
pneumonia or pneumonia outlining bullous emphysematous change. As
neoplasm cannot be entirely excluded, repeat CT imaging after
appropriate antibiotic therapy 3-4 weeks is recommended to assure
improvement and/or resolution.
3. Lesser degree of parenchymal opacification/consolidation in the
left lower lobe.
4. No acute pulmonary embolus.
5. Hepatic steatosis.

Emphysema ([id number]).
